# Patient Record
Sex: Female | Born: 1980 | Hispanic: No | Marital: Single | Smoking: Never smoker
Health system: Southern US, Community
[De-identification: ages and names within clinical notes are randomized; demographics above are authoritative.]

## PROBLEM LIST (undated history)

## (undated) ENCOUNTER — Inpatient Hospital Stay (HOSPITAL_COMMUNITY): Payer: Self-pay

## (undated) DIAGNOSIS — T8859XA Other complications of anesthesia, initial encounter: Secondary | ICD-10-CM

## (undated) DIAGNOSIS — T4145XA Adverse effect of unspecified anesthetic, initial encounter: Secondary | ICD-10-CM

## (undated) DIAGNOSIS — K219 Gastro-esophageal reflux disease without esophagitis: Secondary | ICD-10-CM

## (undated) DIAGNOSIS — R51 Headache: Secondary | ICD-10-CM

## (undated) DIAGNOSIS — F419 Anxiety disorder, unspecified: Secondary | ICD-10-CM

## (undated) DIAGNOSIS — R519 Headache, unspecified: Secondary | ICD-10-CM

## (undated) HISTORY — PX: BREAST SURGERY: SHX581

## (undated) HISTORY — PX: HERNIA REPAIR: SHX51

---
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
## 2014-07-22 LAB — OB RESULTS CONSOLE ANTIBODY SCREEN: ANTIBODY SCREEN: NEGATIVE

## 2014-07-22 LAB — OB RESULTS CONSOLE GC/CHLAMYDIA
CHLAMYDIA, DNA PROBE: NEGATIVE
Gonorrhea: NEGATIVE

## 2014-07-22 LAB — OB RESULTS CONSOLE ABO/RH: RH TYPE: POSITIVE

## 2014-07-22 LAB — OB RESULTS CONSOLE HIV ANTIBODY (ROUTINE TESTING): HIV: NONREACTIVE

## 2014-07-22 LAB — OB RESULTS CONSOLE HEPATITIS B SURFACE ANTIGEN: Hepatitis B Surface Ag: NEGATIVE

## 2014-07-22 LAB — OB RESULTS CONSOLE RUBELLA ANTIBODY, IGM: Rubella: IMMUNE

## 2014-07-22 LAB — OB RESULTS CONSOLE RPR: RPR: NONREACTIVE

## 2014-10-21 ENCOUNTER — Other Ambulatory Visit (HOSPITAL_COMMUNITY): Payer: Self-pay | Admitting: Obstetrics and Gynecology

## 2014-11-04 ENCOUNTER — Encounter (HOSPITAL_COMMUNITY): Payer: Self-pay

## 2014-11-04 ENCOUNTER — Inpatient Hospital Stay (HOSPITAL_COMMUNITY)
Admission: AD | Admit: 2014-11-04 | Discharge: 2014-11-05 | Disposition: A | Discharge status: Home / Self Care | Payer: Medicaid Other | Source: Ambulatory Visit | Attending: Obstetrics and Gynecology | Admitting: Obstetrics and Gynecology

## 2014-11-04 DIAGNOSIS — O4703 False labor before 37 completed weeks of gestation, third trimester: Secondary | ICD-10-CM

## 2014-11-04 DIAGNOSIS — O4702 False labor before 37 completed weeks of gestation, second trimester: Secondary | ICD-10-CM | POA: Diagnosis not present

## 2014-11-04 DIAGNOSIS — Z3A26 26 weeks gestation of pregnancy: Secondary | ICD-10-CM | POA: Diagnosis not present

## 2014-11-04 LAB — URINALYSIS, ROUTINE W REFLEX MICROSCOPIC
Bilirubin Urine: NEGATIVE
Glucose, UA: NEGATIVE mg/dL
HGB URINE DIPSTICK: NEGATIVE
KETONES UR: NEGATIVE mg/dL
NITRITE: NEGATIVE
PROTEIN: NEGATIVE mg/dL
Specific Gravity, Urine: 1.02 (ref 1.005–1.030)
UROBILINOGEN UA: 0.2 mg/dL (ref 0.0–1.0)
pH: 7.5 (ref 5.0–8.0)

## 2014-11-04 LAB — URINE MICROSCOPIC-ADD ON

## 2014-11-04 LAB — WET PREP, GENITAL
Clue Cells Wet Prep HPF POC: NONE SEEN
TRICH WET PREP: NONE SEEN
Yeast Wet Prep HPF POC: NONE SEEN

## 2014-11-04 MED ORDER — NIFEDIPINE 10 MG PO CAPS
10.0000 mg | ORAL_CAPSULE | ORAL | Status: AC
  Administered 2014-11-04 (×3): 10 mg via ORAL
  Filled 2014-11-04 (×3): qty 1

## 2014-11-04 MED ORDER — LACTATED RINGERS IV BOLUS (SEPSIS)
1000.0000 mL | Freq: Once | INTRAVENOUS | Status: AC
  Administered 2014-11-04: 1000 mL via INTRAVENOUS

## 2014-11-04 NOTE — MAU Note (Signed)
Pains since 5:30 pm, felt like constipated then was able to time contractions.  Had 5 contractions in 10 min.  No bleeding. Baby moving well today.  No leaking.

## 2014-11-04 NOTE — MAU Provider Note (Signed)
History     CSN: [id number]  Arrival date and time: 11/04/14 2138   First Provider Initiated Contact with Patient 11/04/14 2256      Chief Complaint  Patient presents with  . Contractions   HPI Comments: Cassandra Scott is a 34 y.o. G2P1001 at [redacted]w[redacted]d who presents today with contractions. She states that she started having abdominal pain a little before 2030. She spoke with her MD, and after she talked with her the contractions started coming every 2 mins. She denies any vaginal bleeding or LOF. She states that the fetus has been active. She denies any recent intercourse. She denies any complications with this pregnancy at this time. She denies any problems or preterm labor with her first pregnancy.    Abdominal Pain This is a new problem. The current episode started today. The onset quality is sudden. The problem occurs intermittently. The pain is located in the suprapubic region. The pain is at a severity of 6/10. The quality of the pain is cramping. The abdominal pain does not radiate. Pertinent negatives include no constipation, diarrhea, dysuria, fever, frequency, nausea or vomiting. Nothing aggravates the pain. The pain is relieved by nothing. She has tried nothing for the symptoms.     History reviewed. No pertinent past medical history.  History reviewed. No pertinent past surgical history.  History reviewed. No pertinent family history.  Social History  Substance Use Topics  . Smoking status: Never Smoker   . Smokeless tobacco: None  . Alcohol Use: No    Allergies: No Known Allergies  Prescriptions prior to admission  Medication Sig Dispense Refill Last Dose  . acetaminophen (TYLENOL) 325 MG tablet Take 325 mg by mouth every 6 (six) hours as needed.    11/03/2014 at Unknown time  . Prenatal Vit-Fe Fumarate-FA (MULTIVITAMIN-PRENATAL) 27-0.8 MG TABS tablet Take 1 tablet by mouth daily at 12 noon.   11/03/2014 at Unknown time    Review of Systems  Constitutional: Negative  for fever.  Gastrointestinal: Positive for abdominal pain. Negative for nausea, vomiting, diarrhea and constipation.  Genitourinary: Negative for dysuria, urgency and frequency.   Physical Exam   Blood pressure 108/67, pulse 73, temperature 98.3 F (36.8 C), temperature source Oral, resp. rate 18, height  (1.575 m), weight 65.772 kg (145 lb).  Physical Exam  Nursing note and vitals reviewed. Constitutional: She is oriented to person, place, and time. She appears well-developed and well-nourished. No distress.  HENT:  Head: Normocephalic.  Cardiovascular: Normal rate.   Respiratory: Effort normal.  GI: Soft. There is no tenderness.  Genitourinary:   External: no lesion Vagina: small amount of white discharge Cervix: pink, smooth, closed/thick/high  Uterus: AGA    Neurological: She is alert and oriented to person, place, and time.  Skin: Skin is warm and dry.  Psychiatric: She has a normal mood and affect.   FHT: 145, moderate with 10x10 accels, no decels Toco: q2 min contractions   Results for orders placed or performed during the hospital encounter of 11/04/14 (from the past 24 hour(s))  Urinalysis, Routine w reflex microscopic (not at [facility])     Status: Abnormal   Collection Time: 11/04/14  9:58 PM  Result Value Ref Range   Color, Urine YELLOW YELLOW   APPearance CLOUDY (A) CLEAR   Specific Gravity, Urine 1.020 1.005 - 1.030   pH 7.5 5.0 - 8.0   Glucose, UA NEGATIVE NEGATIVE mg/dL   Hgb urine dipstick NEGATIVE NEGATIVE   Bilirubin Urine NEGATIVE NEGATIVE  Ketones, ur NEGATIVE NEGATIVE mg/dL   Protein, ur NEGATIVE NEGATIVE mg/dL   Urobilinogen, UA 0.2 0.0 - 1.0 mg/dL   Nitrite NEGATIVE NEGATIVE   Leukocytes, UA TRACE (A) NEGATIVE  Urine microscopic-add on     Status: Abnormal   Collection Time: 11/04/14  9:58 PM  Result Value Ref Range   Squamous Epithelial / LPF FEW (A) RARE   WBC, UA 3-6 <3 WBC/hpf   RBC / HPF 0-2 <3 RBC/hpf   Urine-Other AMORPHOUS  URATES/PHOSPHATES   Wet prep, genital     Status: Abnormal   Collection Time: 11/04/14 11:07 PM  Result Value Ref Range   Yeast Wet Prep HPF POC NONE SEEN NONE SEEN   Trich, Wet Prep NONE SEEN NONE SEEN   Clue Cells Wet Prep HPF POC NONE SEEN NONE SEEN   WBC, Wet Prep HPF POC MODERATE (A) NONE SEEN  Fetal fibronectin     Status: None   Collection Time: 11/04/14 11:07 PM  Result Value Ref Range   Fetal Fibronectin NEGATIVE NEGATIVE    MAU Course  Procedures  MDM 0010: Patient has had 3 doses of procardia. She states that the contractions do not feel as intense at this time. 0016: No cervical change in one hour.   0021: D/W Dr. Senaida Ores. Will give one more dose of procardia 20 mg and watch for one more hour. If contractions remain less painful then patient may be dc home with RX for procardia.  0109; Contractions have continued to space and diminish in frequency. FFN is negative.   Assessment and Plan   1. Threatened preterm labor, third trimester    DC home Comfort measures reviewed  1st/2nd/3rd Trimester precautions  Bleeding precautions Ectopic precautions PTL precautions  Fetal kick counts RX: procardia 20 mg q 4 hours PRN #30  Return to MAU as needed FU with OB as planned  Follow-up Information    Follow up with Oliver Pila, MD.   Specialty:  Obstetrics and Gynecology   Why:  As scheduled   Contact information:   [address] [place] [address] [phone]         Tawnya Crook 11/04/2014, 10:57 PM

## 2014-11-05 DIAGNOSIS — O4703 False labor before 37 completed weeks of gestation, third trimester: Secondary | ICD-10-CM | POA: Diagnosis not present

## 2014-11-05 LAB — GC/CHLAMYDIA PROBE AMP (~~LOC~~) NOT AT ARMC
Chlamydia: NEGATIVE
Neisseria Gonorrhea: NEGATIVE

## 2014-11-05 LAB — FETAL FIBRONECTIN: Fetal Fibronectin: NEGATIVE

## 2014-11-05 MED ORDER — NIFEDIPINE 10 MG PO CAPS
10.0000 mg | ORAL_CAPSULE | ORAL | Status: DC | PRN
Start: 2014-11-05 — End: 2015-02-02

## 2014-11-05 MED ORDER — NIFEDIPINE 10 MG PO CAPS
20.0000 mg | ORAL_CAPSULE | Freq: Once | ORAL | Status: AC
  Administered 2014-11-05: 20 mg via ORAL
  Filled 2014-11-05: qty 2

## 2014-11-05 NOTE — Discharge Instructions (Signed)

## 2014-12-25 ENCOUNTER — Inpatient Hospital Stay (HOSPITAL_COMMUNITY)
Admission: AD | Admit: 2014-12-25 | Discharge: 2014-12-25 | Disposition: A | Discharge status: Home / Self Care | Payer: Medicaid Other | Source: Ambulatory Visit | Attending: Obstetrics and Gynecology | Admitting: Obstetrics and Gynecology

## 2014-12-25 ENCOUNTER — Encounter (HOSPITAL_COMMUNITY): Payer: Self-pay | Admitting: *Deleted

## 2014-12-25 DIAGNOSIS — O26893 Other specified pregnancy related conditions, third trimester: Secondary | ICD-10-CM | POA: Insufficient documentation

## 2014-12-25 DIAGNOSIS — O4703 False labor before 37 completed weeks of gestation, third trimester: Secondary | ICD-10-CM

## 2014-12-25 DIAGNOSIS — O479 False labor, unspecified: Secondary | ICD-10-CM

## 2014-12-25 DIAGNOSIS — Z3A33 33 weeks gestation of pregnancy: Secondary | ICD-10-CM | POA: Insufficient documentation

## 2014-12-25 DIAGNOSIS — R197 Diarrhea, unspecified: Secondary | ICD-10-CM | POA: Diagnosis present

## 2014-12-25 LAB — URINALYSIS, ROUTINE W REFLEX MICROSCOPIC
BILIRUBIN URINE: NEGATIVE
Glucose, UA: NEGATIVE mg/dL
Hgb urine dipstick: NEGATIVE
KETONES UR: NEGATIVE mg/dL
Nitrite: NEGATIVE
PH: 6 (ref 5.0–8.0)
PROTEIN: NEGATIVE mg/dL
Specific Gravity, Urine: 1.015 (ref 1.005–1.030)
Urobilinogen, UA: 0.2 mg/dL (ref 0.0–1.0)

## 2014-12-25 LAB — URINE MICROSCOPIC-ADD ON

## 2014-12-25 LAB — AMNISURE RUPTURE OF MEMBRANE (ROM) NOT AT ARMC: Amnisure ROM: NEGATIVE

## 2014-12-25 LAB — FETAL FIBRONECTIN: FETAL FIBRONECTIN: NEGATIVE

## 2014-12-25 MED ORDER — NIFEDIPINE 10 MG PO CAPS
20.0000 mg | ORAL_CAPSULE | Freq: Once | ORAL | Status: AC
  Administered 2014-12-25: 20 mg via ORAL
  Filled 2014-12-25: qty 2

## 2014-12-25 MED ORDER — LACTATED RINGERS IV BOLUS (SEPSIS)
1000.0000 mL | Freq: Once | INTRAVENOUS | Status: AC
  Administered 2014-12-25: 1000 mL via INTRAVENOUS

## 2014-12-25 MED ORDER — NIFEDIPINE 10 MG PO CAPS: 10.0000 mg | ORAL_CAPSULE | ORAL | Status: DC | PRN

## 2014-12-25 NOTE — Discharge Instructions (Signed)
Braxton Hicks Contractions °Contractions of the uterus can occur throughout pregnancy. Contractions are not always a sign that you are in labor.  °WHAT ARE BRAXTON HICKS CONTRACTIONS?  °Contractions that occur before labor are called Braxton Hicks contractions, or false labor. Toward the end of pregnancy (32-34 weeks), these contractions can develop more often and may become more forceful. This is not true labor because these contractions do not result in opening (dilatation) and thinning of the cervix. They are sometimes difficult to tell apart from true labor because these contractions can be forceful and people have different pain tolerances. You should not feel embarrassed if you go to the hospital with false labor. Sometimes, the only way to tell if you are in true labor is for your health care provider to look for changes in the cervix. °If there are no prenatal problems or other health problems associated with the pregnancy, it is completely safe to be sent home with false labor and await the onset of true labor. °HOW CAN YOU TELL THE DIFFERENCE BETWEEN TRUE AND FALSE LABOR? °False Labor °· The contractions of false labor are usually shorter and not as hard as those of true labor.   °· The contractions are usually irregular.   °· The contractions are often felt in the front of the lower abdomen and in the groin.   °· The contractions may go away when you walk around or change positions while lying down.   °· The contractions get weaker and are shorter lasting as time goes on.   °· The contractions do not usually become progressively stronger, regular, and closer together as with true labor.   °True Labor °· Contractions in true labor last 30-70 seconds, become very regular, usually become more intense, and increase in frequency.   °· The contractions do not go away with walking.   °· The discomfort is usually felt in the top of the uterus and spreads to the lower abdomen and low back.   °· True labor can be  determined by your health care provider with an exam. This will show that the cervix is dilating and getting thinner.   °WHAT TO REMEMBER °· Keep up with your usual exercises and follow other instructions given by your health care provider.   °· Take medicines as directed by your health care provider.   °· Keep your regular prenatal appointments.   °· Eat and drink lightly if you think you are going into labor.   °· If Braxton Hicks contractions are making you uncomfortable:   °· Change your position from lying down or resting to walking, or from walking to resting.   °· Sit and rest in a tub of warm water.   °· Drink 2-3 glasses of water. Dehydration may cause these contractions.   °· Do slow and deep breathing several times an hour.   °WHEN SHOULD I SEEK IMMEDIATE MEDICAL CARE? °Seek immediate medical care if: °· Your contractions become stronger, more regular, and closer together.   °· You have fluid leaking or gushing from your vagina.   °· You have a fever.   °· You pass blood-tinged mucus.   °· You have vaginal bleeding.   °· You have continuous abdominal pain.   °· You have low back pain that you never had before.   °· You feel your baby's head pushing down and causing pelvic pressure.   °· Your baby is not moving as much as it used to.   °Document Released: 03/13/2005 Document Revised: 03/18/2013 Document Reviewed: 12/23/2012 °[employer]® Patient Information ©2015 [employer]. This information is not intended to replace advice given to you by your health care   provider. Make sure you discuss any questions you have with your health care provider.  Fetal Fibronectin This is a test done to help evaluate a pregnant woman's risk of pre-term delivery. It is generally done when you are 26 to [redacted] weeks pregnant and are having symptoms of premature labor. A Dacron swab is used to take a sample of cervical or vaginal fluid from the back portion of the vagina or from the area just outside the opening of the  cervix. Fetal fibronectin (fFN) is a glycoprotein that can be used to help predict the short term risk of premature delivery. fFN is produced at the boundary between the amniotic sac and the lining of the mother's uterus. This is called the unteroplacental junction. Fetal fibronectin is largely confined to this junction and thought to help maintain the integrity of the boundary. fFN is normally detectable in cervicovaginal fluid during the first 20 to 24 weeks of pregnancy, and then is detectable again after about 36 weeks.  Finding fFN in cervicovaginal fluids after 36 weeks is not unusual as it is often released by the body as it gets ready for childbirth. The elevated fFN found in vaginal fluids early in pregnancy may simply reflect the normal growth and establishment of tissues at the unteroplacental junction with levels falling when this phase is complete. What is known is that fFN that is detected between 24 and 36 weeks of pregnancy is not normal. Elevated levels reflect a disturbance at the uteroplacental junction and have been associated with an increased risk of pre-term labor and delivery. Knowing whether or not a woman is likely to deliver prematurely helps your caregiver plan a course of action. The fFN test is a relatively non-invasive tool to help the caregiver to distinguish between those who are likely to deliver shortly and those who are not.  PREPARATION FOR TEST   Inform the person conducting the test if you have a medical condition or are using any medications that cause excessive bleeding.  Do not have sexual intercourse for 24 hours before the procedure. NORMAL FINDINGS  Pregnancy = 50 nanograms/ml Ranges for normal findings may vary among different laboratories and hospitals. You should always check with your doctor after having lab work or other tests done to discuss the meaning of your test results and whether your values are considered within normal limits. MEANING OF TEST   Your caregiver will go over the test results with you and discuss the importance and meaning of your results, as well as treatment options and the need for additional tests if necessary. OBTAINING THE TEST RESULTS  It is your responsibility to obtain your test results. Ask the lab or department performing the test when and how you will get your results. Document Released: 01/13/2004 Document Revised: 06/05/2011 Document Reviewed: 06/09/2013 [facility] Patient Information 2015 [place], [place]. This information is not intended to replace advice given to you by your health care provider. Make sure you discuss any questions you have with your health care provider.  Pelvic Rest Pelvic rest is sometimes recommended for women when:   The placenta is partially or completely covering the opening of the cervix (placenta previa).  There is bleeding between the uterine wall and the amniotic sac in the first trimester (subchorionic hemorrhage).  The cervix begins to open without labor starting (incompetent cervix, cervical insufficiency).  The labor is too early (preterm labor). HOME CARE INSTRUCTIONS  Do not have sexual intercourse, stimulation, or an orgasm.  Do not use tampons, douche,  or put anything in the vagina.  Do not lift anything over 10 pounds (4.5 kg).  Avoid strenuous activity or straining your pelvic muscles. SEEK MEDICAL CARE IF:  You have any vaginal bleeding during pregnancy. Treat this as a potential emergency.  You have cramping pain felt low in the stomach (stronger than menstrual cramps).  You notice vaginal discharge (watery, mucus, or bloody).  You have a low, dull backache.  There are regular contractions or uterine tightening. SEEK IMMEDIATE MEDICAL CARE IF: You have vaginal bleeding and have placenta previa.  Document Released: 07/08/2010 Document Revised: 06/05/2011 Document Reviewed: 07/08/2010 [facility] Patient Information 2015 [place], [place]. This information  is not intended to replace advice given to you by your health care provider. Make sure you discuss any questions you have with your health care provider.

## 2014-12-25 NOTE — MAU Note (Signed)
Diarrhea today having contractions at least 10 in one hour, history of preterm labor at 26 weeks, denies vaginal bleeding or discharge.

## 2014-12-25 NOTE — MAU Provider Note (Signed)
Chief Complaint:  Contractions and Diarrhea   First Provider Initiated Contact with Patient 12/25/14 1903      HPI: Cassandra Scott is a 34 y.o. G2P1001 at 24w4dwho presents to maternity admissions reporting onset of loose stools and diarrhea 2 days ago with frequent soft bowel movements each day and diarrhea x 1-2 daily, then onset of abdominal cramping/contractions at 3 pm today that have gradually worsened.  She has lower pelvic pain with contractions that is stronger than when they started this afternoon.  While in MAU, she reports leakage of clear fluid, but not enough to require a pad. She reports good fetal movement, denies LOF, vaginal bleeding, vaginal itching/burning, urinary symptoms, h/a, dizziness, n/v, or fever/chills.    Diarrhea  This is a new problem. The current episode started in the past 7 days. The problem occurs less than 2 times per day. The problem has been unchanged. The stool consistency is described as watery. The patient states that diarrhea does not awaken her from sleep. Associated symptoms include abdominal pain. Pertinent negatives include no chills, fever, headaches or vomiting. Nothing aggravates the symptoms. There are no known risk factors. She has tried increased fluids for the symptoms.  Abdominal Cramping This is a new problem. The current episode started today. The onset quality is gradual. The problem occurs intermittently. The problem has been gradually worsening. The pain is located in the generalized abdominal region and suprapubic region. The pain is moderate. The quality of the pain is cramping. The abdominal pain radiates to the pelvis. Associated symptoms include diarrhea. Pertinent negatives include no constipation, dysuria, fever, frequency, headaches, nausea or vomiting. Nothing aggravates the pain. The pain is relieved by nothing. She has tried nothing for the symptoms.    Past Medical History: History reviewed. No pertinent past medical  history.  Past obstetric history: OB History  Gravida Para Term Preterm AB SAB TAB Ectopic Multiple Living  # Outcome Date GA Lbr Len/2nd Weight Sex Delivery Anes PTL Lv  2 Current           1 Term 07/19/08     [id number]         Past Surgical History: Past Surgical History  Procedure Laterality Date  . Cesarean section    . Breast surgery      Family History: History reviewed. No pertinent family history.  Social History: Social History  Substance Use Topics  . Smoking status: Never Smoker   . Smokeless tobacco: None  . Alcohol Use: No    Allergies: No Known Allergies  Meds:  Prescriptions prior to admission  Medication Sig Dispense Refill Last Dose  . acetaminophen (TYLENOL) 500 MG tablet Take 500 mg by mouth every 6 (six) hours as needed for headache.   Past Month at Unknown time  . Prenatal Vit-Fe Fumarate-FA (MULTIVITAMIN-PRENATAL) 27-0.8 MG TABS tablet Take 1 tablet by mouth daily at 12 noon.   12/24/2014 at Unknown time  . NIFEdipine (PROCARDIA) 10 MG capsule Take 1-2 capsules (10-20 mg total) by mouth every 4 (four) hours as needed. (Patient not taking: Reported on 12/25/2014) 30 capsule 0 Completed Course at Unknown time    ROS:  Review of Systems  Constitutional: Negative for fever, chills and fatigue.  HENT: Negative for sinus pressure.   Eyes: Negative for photophobia.  Respiratory: Negative for shortness of breath.   Cardiovascular: Negative for chest pain.  Gastrointestinal: Positive for abdominal pain and diarrhea. Negative  for nausea, vomiting and constipation.  Genitourinary: Negative for dysuria, frequency, flank pain, vaginal bleeding, vaginal discharge, difficulty urinating, vaginal pain and pelvic pain.  Musculoskeletal: Negative for neck pain.  Neurological: Negative for dizziness, weakness and headaches.  Psychiatric/Behavioral: Negative.      I have reviewed patient's Past Medical Hx, Surgical Hx, Family Hx, Social Hx,  medications and allergies.   Physical Exam  Patient Vitals for the past 24 hrs:  BP Temp Temp src Pulse Resp Height Weight  12/25/14 1814 109/68 mmHg 98 F (36.7 C) Oral 84 18  (1.575 m) 69.491 kg (153 lb 3.2 oz)   Constitutional: Well-developed, well-nourished female in no acute distress.  Cardiovascular: normal rate Respiratory: normal effort GI: Abd soft, non-tender, gravid appropriate for gestational age.  MS: Extremities nontender, no edema, normal ROM Neurologic: Alert and oriented x 4.  GU: Neg CVAT.   Dilation: Closed Effacement (%): Thick Cervical Position: Posterior Exam by:: Sharen Counter, CNM  FHT:  Baseline 135 , moderate variability, accelerations present, no decelerations Contractions: q 2-3 mins, mild to palpation   Labs: Results for orders placed or performed during the hospital encounter of 12/25/14 (from the past 24 hour(s))  Urinalysis, Routine w reflex microscopic (not at [facility])     Status: Abnormal   Collection Time: 12/25/14  6:15 PM  Result Value Ref Range   Color, Urine YELLOW YELLOW   APPearance CLEAR CLEAR   Specific Gravity, Urine 1.015 1.005 - 1.030   pH 6.0 5.0 - 8.0   Glucose, UA NEGATIVE NEGATIVE mg/dL   Hgb urine dipstick NEGATIVE NEGATIVE   Bilirubin Urine NEGATIVE NEGATIVE   Ketones, ur NEGATIVE NEGATIVE mg/dL   Protein, ur NEGATIVE NEGATIVE mg/dL   Urobilinogen, UA 0.2 0.0 - 1.0 mg/dL   Nitrite NEGATIVE NEGATIVE   Leukocytes, UA TRACE (A) NEGATIVE  Urine microscopic-add on     Status: Abnormal   Collection Time: 12/25/14  6:15 PM  Result Value Ref Range   Squamous Epithelial / LPF RARE RARE   WBC, UA 0-2 <3 WBC/hpf   RBC / HPF 0-2 <3 RBC/hpf   Bacteria, UA FEW (A) RARE  Amnisure rupture of membrane (rom)not at [facility]     Status: None   Collection Time: 12/25/14  7:10 PM  Result Value Ref Range   Amnisure ROM NEGATIVE   Fetal fibronectin     Status: None   Collection Time: 12/25/14  7:10 PM  Result Value Ref Range    Fetal Fibronectin NEGATIVE NEGATIVE      Imaging:  No results found.  MAU Course/MDM: I have ordered labs and reviewed results.  Consult Dr Hinton Rao to review assessment, labs, FHR tracing.  Treatments in MAU included Procardia 20 mg x 1 dose PO, LR x 1000 ml bolus.    FFN pending. Procardia 20 mg PO dose and IV fluids ordered.  Report to Venia Carbon, NP  Cervix rechecked and unchanged @ 2130 patient states contractions are still felt however much less.  Dr. Ellyn Hack contacted and unable to talk at this time due to an emergency and will call back as soon as possible.   Discussed patient status with Dr. Ellyn Hack @ 2250> FFN negative, Amnisure negative. Patient continues to have contractions every 3-4 minutes however pain is decreased. Patient rates her pain 4/10, down from 7/10 when she came in . Patient is eating in MAU, conversing with hesitation. Patient feels comfortable going home and will return if symptoms worsen. Patient was given preterm labor precautions and  strongly encouraged to return if patient worsens throughout the night>vaginal bleeding or leaking of fluid. Kick counts reviewed.   Sharen Counter Certified Nurse-Midwife 12/25/2014 7:52 PM   Duane Lope, NP 12/25/2014 10:56 PM

## 2014-12-25 NOTE — MAU Note (Signed)
Patient states is scheduled for repeat c-secton on Nov 8th.

## 2015-01-27 ENCOUNTER — Other Ambulatory Visit: Payer: Self-pay | Admitting: Obstetrics and Gynecology

## 2015-02-01 ENCOUNTER — Encounter (HOSPITAL_COMMUNITY)
Admission: RE | Admit: 2015-02-01 | Discharge: 2015-02-01 | Disposition: A | Discharge status: Home / Self Care | Payer: Medicaid Other | Source: Ambulatory Visit | Attending: Obstetrics and Gynecology | Admitting: Obstetrics and Gynecology

## 2015-02-01 ENCOUNTER — Encounter (HOSPITAL_COMMUNITY): Payer: Self-pay

## 2015-02-01 HISTORY — DX: Gastro-esophageal reflux disease without esophagitis: K21.9

## 2015-02-01 HISTORY — DX: Adverse effect of unspecified anesthetic, initial encounter: T41.45XA

## 2015-02-01 HISTORY — DX: Headache: R51

## 2015-02-01 HISTORY — DX: Anxiety disorder, unspecified: F41.9

## 2015-02-01 HISTORY — DX: Headache, unspecified: R51.9

## 2015-02-01 HISTORY — DX: Other complications of anesthesia, initial encounter: T88.59XA

## 2015-02-01 LAB — CBC WITH DIFFERENTIAL/PLATELET
BASOS PCT: 0 %
Basophils Absolute: 0 10*3/uL (ref 0.0–0.1)
Eosinophils Absolute: 0 10*3/uL (ref 0.0–0.7)
Eosinophils Relative: 0 %
HEMATOCRIT: 36.2 % (ref 36.0–46.0)
Hemoglobin: 12.1 g/dL (ref 12.0–15.0)
Lymphocytes Relative: 20 %
Lymphs Abs: 1.5 10*3/uL (ref 0.7–4.0)
MCH: 30.2 pg (ref 26.0–34.0)
MCHC: 33.4 g/dL (ref 30.0–36.0)
MCV: 90.3 fL (ref 78.0–100.0)
MONO ABS: 0.6 10*3/uL (ref 0.1–1.0)
MONOS PCT: 8 %
NEUTROS ABS: 5.3 10*3/uL (ref 1.7–7.7)
Neutrophils Relative %: 72 %
Platelets: 156 10*3/uL (ref 150–400)
RBC: 4.01 MIL/uL (ref 3.87–5.11)
RDW: 14.4 % (ref 11.5–15.5)
WBC: 7.4 10*3/uL (ref 4.0–10.5)

## 2015-02-01 LAB — COMPREHENSIVE METABOLIC PANEL
ALBUMIN: 3 g/dL — AB (ref 3.5–5.0)
ALT: 15 U/L (ref 14–54)
AST: 19 U/L (ref 15–41)
Alkaline Phosphatase: 191 U/L — ABNORMAL HIGH (ref 38–126)
Anion gap: 7 (ref 5–15)
BILIRUBIN TOTAL: 0.7 mg/dL (ref 0.3–1.2)
BUN: 12 mg/dL (ref 6–20)
CALCIUM: 8.8 mg/dL — AB (ref 8.9–10.3)
CHLORIDE: 106 mmol/L (ref 101–111)
CO2: 21 mmol/L — ABNORMAL LOW (ref 22–32)
CREATININE: 0.62 mg/dL (ref 0.44–1.00)
GFR calc Af Amer: >60 mL/min (ref >60)
Glucose, Bld: 76 mg/dL (ref 65–99)
Potassium: 4 mmol/L (ref 3.5–5.1)
Sodium: 134 mmol/L — ABNORMAL LOW (ref 135–145)
Total Protein: 6.5 g/dL (ref 6.5–8.1)

## 2015-02-01 LAB — TYPE AND SCREEN
ABO/RH(D): O POS
Antibody Screen: NEGATIVE

## 2015-02-01 LAB — ABO/RH: ABO/RH(D): O POS

## 2015-02-01 NOTE — Patient Instructions (Signed)
Your procedure is scheduled on:02/02/15  Enter through the Main Entrance at :6am Pick up desk phone and dial [phone] and inform us of your arrival.  Please call [phone] if you have any problems the morning of surgery.  Remember: Do not eat food or drink liquids, including water, after midnight:tonight   You may brush your teeth the morning of surgery.  Take these meds the morning of surgery with a sip of water:none  DO NOT wear jewelry, eye make-up, lipstick,body lotion, or dark fingernail polish.  (Polished toes are ok) You may wear deodorant.  If you are to be admitted after surgery, leave suitcase in car until your room has been assigned. Patients discharged on the day of surgery will not be allowed to drive home. Wear loose fitting, comfortable clothes for your ride home.

## 2015-02-01 NOTE — H&P (Deleted)
NAMRondall Scott:  Scott, Cassandra             ACCOUNT NO.:  [id number]  MEDICAL RECORD NO.:  [id number]  LOCATION:  [facility]                           FACILITY:  [facility]  PHYSICIAN:  Malachi Prohomas F. Ambrose MantleHenley, M.D. DATE OF BIRTH:  07/23/1980  DATE OF ADMISSION:  02/01/2015 DATE OF DISCHARGE:                             HISTORY & PHYSICAL   PRESENT ILLNESS:  This is a 34 year old white female, para 1-0-0-1, gravida 2 at 39 weeks and 1 day admitted for repeat C-section.  She had a prior C-section and declined a vaginal birth.  With her first C- section, she was given a spinal and 5 minutes later she had severe vomiting and the worst headache she had ever had.  She could not function in the recovery room.  She was given IV med that relieved her headache and corrected the vomiting.  Her blood group and type is O positive with a negative antibody.  RPR negative.  Urine culture negative.  Hepatitis B surface antigen negative.  HIV negative.  GC and Chlamydia negative.  Varicella not immune.  Rubella immune.  Cystic fibrosis screen negative.  First trimester screen negative.  AFP negative.  One-hour Glucola 109.  Repeat HIV and RPR negative.  Group B strep negative.  Bile acids were checked in late pregnancy and they were normal as well as liver function tests.  The patient began her prenatal course  at 10 weeks.  She had an ultrasound on July 17, 2014, placed her at 10 weeks with a due date of February 08, 2015.  She progressed in pregnancy and initially had poor weight gain, only 3 pounds by 18 weeks and 6 days.  Her total weight gain for the entire pregnancy, however, is 25 pounds.  At 31 weeks, she did have problems with itching.  At 33 weeks, she had a lot of painful contractions, seen in [facility], fetal fibronectin test was negative.  On January 04, 2015, she was having a lot of contractions but her cervix was closed.  She reported more contractions today.  She was examined in the office.  The cervix was still long  and closed, the presenting part was high.  If she makes it to the morning of November 8, she will have repeat C-section.  If she goes into labor prior, she will have a C-section then.  PAST MEDICAL HISTORY:  Negative.  SURGICAL HISTORY:  C-section in April 2010, breast implants in 2005 and 2008.  Her C-section was low-transverse. According to the notes there, her headache began after the spinal but before the incision and improved during the case.  ALLERGIES:  There are no known drug allergies.  No latex or food allergies.  SOCIAL HISTORY:  She never smoked.  Does not drink.  Denies drugs.  Has 12th grade of education.  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  FAMILY HISTORY:  Mother with anxiety and panic attacks.  Brother with asthma, ADHD.  Father with asthma. Maternal grandmother with DVT.  Son has attention deficit and hyperactive disorder.  PHYSICAL EXAMINATION:  VITAL SIGNS:  On February 01, 2015, blood pressure was 100/66, weight 161.6, pulse was 80. HEART:  Normal size and sounds.  No murmurs. LUNGS:  Clear to auscultation. ABDOMEN:  Fundal height was 40 cm.  Fetal heart tones normal.  Cervix was long and closed, presenting part high.  ADMITTING IMPRESSION:  Intrauterine pregnancy at 39 weeks, prior C- section, declined VBAC.  The patient is admitted for C-section.  She has been informed that the art on her lower abdomen is very close to the midline and I may or may not be able to stay away from cutting through the tattoos.  She understands and agrees.     Malachi Pro. Ambrose Mantle, M.D.     TFH/MEDQ  D:  02/01/2015  T:  02/01/2015  Job:  [id number]

## 2015-02-01 NOTE — H&P (Signed)
NAMEANNETTA, DEISS NO.:  [id number]  MEDICAL RECORD NO.:  [id number]  LOCATION:  9132                          FACILITY:  [facility]  PHYSICIAN:  Malachi Pro. Ambrose Mantle, M.D. DATE OF BIRTH:  [birth year]  DATE OF ADMISSION:  02/02/2015 DATE OF DISCHARGE:  02/04/2015                             HISTORY & PHYSICAL   PRESENT ILLNESS:  This is a 34 year old white female, para 1-0-0-1, gravida 2 at 39 weeks and 1 day admitted for repeat C-section.  She had a prior C-section and declined a vaginal birth.  With her first C- section, she was given a spinal and 5 minutes later she had severe vomiting and the worst headache she had ever had.  She could not function in the recovery room.  She was given IV med that relieved her headache and corrected the vomiting.  Her blood group and type is O positive with a negative antibody.  RPR negative.  Urine culture negative.  Hepatitis B surface antigen negative.  HIV negative.  GC and Chlamydia negative.  Varicella not immune.  Rubella immune.  Cystic fibrosis screen negative.  First trimester screen negative.  AFP negative.  One-hour Glucola 109.  Repeat HIV and RPR negative.  Group B strep negative.  Bile acids were checked in late pregnancy and they were normal as well as liver function tests.  The patient began her prenatal course  at 10 weeks.  She had an ultrasound on July 17, 2014, placed her at 10 weeks with a due date of February 08, 2015.  She progressed in pregnancy and initially had poor weight gain, only 3 pounds by 18 weeks and 6 days.  Her total weight gain for the entire pregnancy, however, is 25 pounds.  At 31 weeks, she did have problems with itching.  At 33 weeks, she had a lot of painful contractions, seen in [facility], fetal fibronectin test was negative.  On January 04, 2015, she was having a lot of contractions but her cervix was closed.  She reported more contractions today.  She was examined in the office.  The cervix  was still long and closed, the presenting part was high.  If she makes it to the morning of November 8, she will have repeat C-section.  If she goes into labor prior, she will have a C-section then.  PAST MEDICAL HISTORY:  Negative.  SURGICAL HISTORY:  C-section in April 2010, breast implants in 2005 and 2008.  Her C-section was low-transverse. According to the notes there, her headache began after the spinal but before the incision and improved during the case.  ALLERGIES:  There are no known drug allergies.  No latex or food allergies.  SOCIAL HISTORY:  She never smoked.  Does not drink.  Denies drugs.  Has 12th grade of education.  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  FAMILY HISTORY:  Mother with anxiety and panic attacks.  Brother with asthma, ADHD.  Father with asthma. Maternal grandmother with DVT.  Son has attention deficit and hyperactive disorder.  PHYSICAL EXAMINATION:  VITAL SIGNS:  On February 01, 2015, blood pressure was 100/66, weight 161.6, pulse was 80. HEART:  Normal size and sounds.  No murmurs.  LUNGS:  Clear to auscultation. ABDOMEN:  Fundal height was 40 cm.  Fetal heart tones normal.  Cervix was long and closed, presenting part high.  ADMITTING IMPRESSION:  Intrauterine pregnancy at 39 weeks, prior C- section, declined VBAC.  The patient is admitted for C-section.  She has been informed that the art on her lower abdomen is very close to the midline and I may or may not be able to stay away from cutting through the tattoos.  She understands and agrees.     Malachi Prohomas F. Ambrose MantleHenley, M.D.     TFH/MEDQ  D:  02/01/2015  T:  02/01/2015  Job:  [id number]

## 2015-02-02 ENCOUNTER — Inpatient Hospital Stay (HOSPITAL_COMMUNITY): Payer: Medicaid Other | Admitting: Anesthesiology

## 2015-02-02 ENCOUNTER — Encounter (HOSPITAL_COMMUNITY): Payer: Self-pay

## 2015-02-02 ENCOUNTER — Inpatient Hospital Stay (HOSPITAL_COMMUNITY)
Admission: RE | Admit: 2015-02-02 | Discharge: 2015-02-04 | DRG: 766 | Disposition: A | Discharge status: Home / Self Care | Payer: Medicaid Other | Source: Ambulatory Visit | Attending: Obstetrics and Gynecology | Admitting: Obstetrics and Gynecology

## 2015-02-02 ENCOUNTER — Encounter (HOSPITAL_COMMUNITY)
Admission: RE | Disposition: A | Discharge status: Home / Self Care | Payer: Self-pay | Source: Ambulatory Visit | Attending: Obstetrics and Gynecology

## 2015-02-02 DIAGNOSIS — O34211 Maternal care for low transverse scar from previous cesarean delivery: Secondary | ICD-10-CM | POA: Diagnosis present

## 2015-02-02 DIAGNOSIS — O34219 Maternal care for unspecified type scar from previous cesarean delivery: Secondary | ICD-10-CM | POA: Diagnosis present

## 2015-02-02 DIAGNOSIS — Z98891 History of uterine scar from previous surgery: Secondary | ICD-10-CM

## 2015-02-02 DIAGNOSIS — Z3A39 39 weeks gestation of pregnancy: Secondary | ICD-10-CM

## 2015-02-02 LAB — RPR: RPR: NONREACTIVE

## 2015-02-02 SURGERY — Surgical Case
Anesthesia: Spinal

## 2015-02-02 MED ORDER — MEASLES, MUMPS & RUBELLA VAC ~~LOC~~ INJ: 0.5000 mL | INJECTION | Freq: Once | SUBCUTANEOUS | Status: DC

## 2015-02-02 MED ORDER — LACTATED RINGERS IV SOLN
INTRAVENOUS | Status: DC
  Administered 2015-02-02: 16:00:00 via INTRAVENOUS

## 2015-02-02 MED ORDER — GLYCOPYRROLATE 0.2 MG/ML IJ SOLN
INTRAMUSCULAR | Status: AC
  Filled 2015-02-02: qty 1

## 2015-02-02 MED ORDER — NALOXONE HCL 0.4 MG/ML IJ SOLN: 0.4000 mg | INTRAMUSCULAR | Status: DC | PRN

## 2015-02-02 MED ORDER — NALBUPHINE HCL 10 MG/ML IJ SOLN: 5.0000 mg | Freq: Once | INTRAMUSCULAR | Status: DC | PRN

## 2015-02-02 MED ORDER — PHENYLEPHRINE 40 MCG/ML (10ML) SYRINGE FOR IV PUSH (FOR BLOOD PRESSURE SUPPORT)
PREFILLED_SYRINGE | INTRAVENOUS | Status: AC
  Filled 2015-02-02: qty 10

## 2015-02-02 MED ORDER — IBUPROFEN 600 MG PO TABS
600.0000 mg | ORAL_TABLET | Freq: Four times a day (QID) | ORAL | Status: DC
Start: 2015-02-02 — End: 2015-02-04
  Administered 2015-02-02 – 2015-02-04 (×6): 600 mg via ORAL
  Filled 2015-02-02 (×6): qty 1

## 2015-02-02 MED ORDER — GLYCOPYRROLATE 0.2 MG/ML IJ SOLN
INTRAMUSCULAR | Status: DC | PRN
  Administered 2015-02-02: 0.2 mg via INTRAVENOUS

## 2015-02-02 MED ORDER — KETOROLAC TROMETHAMINE 30 MG/ML IJ SOLN
INTRAMUSCULAR | Status: AC
  Filled 2015-02-02: qty 1

## 2015-02-02 MED ORDER — PHENYLEPHRINE 8 MG IN D5W 100 ML (0.08MG/ML) PREMIX OPTIME
INJECTION | INTRAVENOUS | Status: DC | PRN
  Administered 2015-02-02: 60 ug/min via INTRAVENOUS

## 2015-02-02 MED ORDER — MEPERIDINE HCL 25 MG/ML IJ SOLN: 6.2500 mg | INTRAMUSCULAR | Status: DC | PRN

## 2015-02-02 MED ORDER — CEFAZOLIN SODIUM-DEXTROSE 2-3 GM-% IV SOLR
2.0000 g | INTRAVENOUS | Status: AC
  Administered 2015-02-02: 2 g via INTRAVENOUS

## 2015-02-02 MED ORDER — NALBUPHINE HCL 10 MG/ML IJ SOLN: 5.0000 mg | INTRAMUSCULAR | Status: DC | PRN

## 2015-02-02 MED ORDER — SIMETHICONE 80 MG PO CHEW: 80.0000 mg | CHEWABLE_TABLET | ORAL | Status: DC | PRN

## 2015-02-02 MED ORDER — DIPHENHYDRAMINE HCL 50 MG/ML IJ SOLN: 12.5000 mg | INTRAMUSCULAR | Status: DC | PRN

## 2015-02-02 MED ORDER — SIMETHICONE 80 MG PO CHEW
80.0000 mg | CHEWABLE_TABLET | Freq: Three times a day (TID) | ORAL | Status: DC
  Administered 2015-02-02 – 2015-02-04 (×6): 80 mg via ORAL
  Filled 2015-02-02 (×6): qty 1

## 2015-02-02 MED ORDER — LACTATED RINGERS IV SOLN
INTRAVENOUS | Status: DC
  Administered 2015-02-02 (×2): 125 mL/h via INTRAVENOUS

## 2015-02-02 MED ORDER — PHENYLEPHRINE 8 MG IN D5W 100 ML (0.08MG/ML) PREMIX OPTIME
INJECTION | INTRAVENOUS | Status: AC
Start: 2015-02-02 — End: 2015-02-02
  Filled 2015-02-02: qty 100

## 2015-02-02 MED ORDER — KETOROLAC TROMETHAMINE 30 MG/ML IJ SOLN: 30.0000 mg | Freq: Four times a day (QID) | INTRAMUSCULAR | Status: AC | PRN

## 2015-02-02 MED ORDER — ZOLPIDEM TARTRATE 5 MG PO TABS: 5.0000 mg | ORAL_TABLET | Freq: Every evening | ORAL | Status: DC | PRN

## 2015-02-02 MED ORDER — ONDANSETRON HCL 4 MG/2ML IJ SOLN
INTRAMUSCULAR | Status: DC | PRN
  Administered 2015-02-02: 4 mg via INTRAVENOUS

## 2015-02-02 MED ORDER — FENTANYL CITRATE (PF) 100 MCG/2ML IJ SOLN: 25.0000 ug | INTRAMUSCULAR | Status: DC | PRN

## 2015-02-02 MED ORDER — ACETAMINOPHEN 325 MG PO TABS
650.0000 mg | ORAL_TABLET | ORAL | Status: DC | PRN
  Administered 2015-02-03 – 2015-02-04 (×5): 650 mg via ORAL
  Filled 2015-02-02 (×5): qty 2

## 2015-02-02 MED ORDER — PHENYLEPHRINE HCL 10 MG/ML IJ SOLN
INTRAMUSCULAR | Status: DC | PRN
  Administered 2015-02-02 (×3): 40 ug via INTRAVENOUS
  Administered 2015-02-02: 80 ug via INTRAVENOUS

## 2015-02-02 MED ORDER — ONDANSETRON HCL 4 MG/2ML IJ SOLN
INTRAMUSCULAR | Status: AC
  Filled 2015-02-02: qty 2

## 2015-02-02 MED ORDER — SCOPOLAMINE 1 MG/3DAYS TD PT72
1.0000 | MEDICATED_PATCH | Freq: Once | TRANSDERMAL | Status: DC
  Administered 2015-02-02: 1.5 mg via TRANSDERMAL

## 2015-02-02 MED ORDER — MORPHINE SULFATE (PF) 0.5 MG/ML IJ SOLN
INTRAMUSCULAR | Status: DC | PRN
Start: 2015-02-02 — End: 2015-02-02
  Administered 2015-02-02: .1 mg via EPIDURAL

## 2015-02-02 MED ORDER — CEFAZOLIN SODIUM-DEXTROSE 2-3 GM-% IV SOLR
INTRAVENOUS | Status: AC
  Filled 2015-02-02: qty 50

## 2015-02-02 MED ORDER — MENTHOL 3 MG MT LOZG: 1.0000 | LOZENGE | OROMUCOSAL | Status: DC | PRN

## 2015-02-02 MED ORDER — TETANUS-DIPHTH-ACELL PERTUSSIS 5-2.5-18.5 LF-MCG/0.5 IM SUSP
0.5000 mL | Freq: Once | INTRAMUSCULAR | Status: DC
Start: 2015-02-03 — End: 2015-02-02

## 2015-02-02 MED ORDER — ONDANSETRON HCL 4 MG/2ML IJ SOLN: 4.0000 mg | Freq: Three times a day (TID) | INTRAMUSCULAR | Status: DC | PRN

## 2015-02-02 MED ORDER — CEFAZOLIN SODIUM-DEXTROSE 2-3 GM-% IV SOLR
2.0000 g | Freq: Three times a day (TID) | INTRAVENOUS | Status: AC
  Administered 2015-02-02 (×2): 2 g via INTRAVENOUS
  Filled 2015-02-02 (×2): qty 50

## 2015-02-02 MED ORDER — SODIUM CHLORIDE 0.9 % IJ SOLN: 3.0000 mL | INTRAMUSCULAR | Status: DC | PRN

## 2015-02-02 MED ORDER — DIPHENHYDRAMINE HCL 25 MG PO CAPS: 25.0000 mg | ORAL_CAPSULE | Freq: Four times a day (QID) | ORAL | Status: DC | PRN

## 2015-02-02 MED ORDER — OXYTOCIN 10 UNIT/ML IJ SOLN
40.0000 [IU] | INTRAVENOUS | Status: DC | PRN
  Administered 2015-02-02: 40 [IU] via INTRAVENOUS

## 2015-02-02 MED ORDER — OXYTOCIN 10 UNIT/ML IJ SOLN
INTRAMUSCULAR | Status: AC
  Filled 2015-02-02: qty 4

## 2015-02-02 MED ORDER — BUPIVACAINE IN DEXTROSE 0.75-8.25 % IT SOLN
INTRATHECAL | Status: DC | PRN
  Administered 2015-02-02: 10 mg via INTRATHECAL

## 2015-02-02 MED ORDER — DIPHENHYDRAMINE HCL 25 MG PO CAPS: 25.0000 mg | ORAL_CAPSULE | ORAL | Status: DC | PRN

## 2015-02-02 MED ORDER — FENTANYL CITRATE (PF) 100 MCG/2ML IJ SOLN
INTRAMUSCULAR | Status: AC
Start: 2015-02-02 — End: 2015-02-02
  Filled 2015-02-02: qty 4

## 2015-02-02 MED ORDER — OXYTOCIN 40 UNITS IN LACTATED RINGERS INFUSION - SIMPLE MED: 62.5000 mL/h | INTRAVENOUS | Status: AC

## 2015-02-02 MED ORDER — OXYCODONE-ACETAMINOPHEN 5-325 MG PO TABS: 2.0000 | ORAL_TABLET | ORAL | Status: DC | PRN

## 2015-02-02 MED ORDER — LANOLIN HYDROUS EX OINT: 1.0000 "application " | TOPICAL_OINTMENT | CUTANEOUS | Status: DC | PRN

## 2015-02-02 MED ORDER — MORPHINE SULFATE (PF) 0.5 MG/ML IJ SOLN
INTRAMUSCULAR | Status: AC
  Filled 2015-02-02: qty 100

## 2015-02-02 MED ORDER — KETOROLAC TROMETHAMINE 30 MG/ML IJ SOLN
30.0000 mg | Freq: Four times a day (QID) | INTRAMUSCULAR | Status: AC | PRN
  Administered 2015-02-02: 30 mg via INTRAMUSCULAR

## 2015-02-02 MED ORDER — OXYCODONE-ACETAMINOPHEN 5-325 MG PO TABS: 1.0000 | ORAL_TABLET | ORAL | Status: DC | PRN

## 2015-02-02 MED ORDER — SCOPOLAMINE 1 MG/3DAYS TD PT72
MEDICATED_PATCH | TRANSDERMAL | Status: AC
  Filled 2015-02-02: qty 1

## 2015-02-02 MED ORDER — IBUPROFEN 600 MG PO TABS
600.0000 mg | ORAL_TABLET | Freq: Four times a day (QID) | ORAL | Status: DC | PRN
  Administered 2015-02-02: 600 mg via ORAL
  Filled 2015-02-02: qty 1

## 2015-02-02 MED ORDER — PHENYLEPHRINE 8 MG IN D5W 100 ML (0.08MG/ML) PREMIX OPTIME: INJECTION | INTRAVENOUS | Status: DC | PRN

## 2015-02-02 MED ORDER — SIMETHICONE 80 MG PO CHEW
80.0000 mg | CHEWABLE_TABLET | ORAL | Status: DC
  Administered 2015-02-03 – 2015-02-04 (×2): 80 mg via ORAL
  Filled 2015-02-02: qty 1

## 2015-02-02 MED ORDER — NALOXONE HCL 2 MG/2ML IJ SOSY: 1.0000 ug/kg/h | PREFILLED_SYRINGE | INTRAVENOUS | Status: DC | PRN

## 2015-02-02 MED ORDER — WITCH HAZEL-GLYCERIN EX PADS: 1.0000 "application " | MEDICATED_PAD | CUTANEOUS | Status: DC | PRN

## 2015-02-02 MED ORDER — PRENATAL MULTIVITAMIN CH
1.0000 | ORAL_TABLET | Freq: Every day | ORAL | Status: DC
  Administered 2015-02-03: 1 via ORAL
  Filled 2015-02-02: qty 1

## 2015-02-02 MED ORDER — SENNOSIDES-DOCUSATE SODIUM 8.6-50 MG PO TABS
2.0000 | ORAL_TABLET | ORAL | Status: DC
  Administered 2015-02-03 – 2015-02-04 (×2): 2 via ORAL
  Filled 2015-02-02: qty 2

## 2015-02-02 MED ORDER — FENTANYL CITRATE (PF) 100 MCG/2ML IJ SOLN
INTRAMUSCULAR | Status: DC | PRN
  Administered 2015-02-02: 20 ug via INTRAVENOUS

## 2015-02-02 MED ORDER — DIBUCAINE 1 % RE OINT: 1.0000 "application " | TOPICAL_OINTMENT | RECTAL | Status: DC | PRN

## 2015-02-02 SURGICAL SUPPLY — 39 items
APL SKNCLS STERI-STRIP NONHPOA (GAUZE/BANDAGES/DRESSINGS) ×1
BENZOIN TINCTURE PRP APPL 2/3 (GAUZE/BANDAGES/DRESSINGS) ×2 IMPLANT
CLAMP CORD UMBIL (MISCELLANEOUS) IMPLANT
CLOSURE STERI STRIP 1/2 X4 (GAUZE/BANDAGES/DRESSINGS) ×2 IMPLANT
CLOTH BEACON ORANGE TIMEOUT ST (SAFETY) ×3 IMPLANT
CONTAINER PREFILL 10% NBF 15ML (MISCELLANEOUS) IMPLANT
DRAPE SHEET LG 3/4 BI-LAMINATE (DRAPES) IMPLANT
DRSG OPSITE POSTOP 4X10 (GAUZE/BANDAGES/DRESSINGS) ×3 IMPLANT
DRSG VASELINE 3X18 (GAUZE/BANDAGES/DRESSINGS) ×3 IMPLANT
DURAPREP 26ML APPLICATOR (WOUND CARE) ×3 IMPLANT
ELECT REM PT RETURN 9FT ADLT (ELECTROSURGICAL) ×3
ELECTRODE REM PT RTRN 9FT ADLT (ELECTROSURGICAL) ×1 IMPLANT
EXTRACTOR VACUUM KIWI (MISCELLANEOUS) IMPLANT
EXTRACTOR VACUUM M CUP 4 TUBE (SUCTIONS) IMPLANT
EXTRACTOR VACUUM M CUP 4' TUBE (SUCTIONS)
GLOVE BIO SURGEON STRL SZ7.5 (GLOVE) ×3 IMPLANT
GOWN STRL REUS W/TWL LRG LVL3 (GOWN DISPOSABLE) ×6 IMPLANT
KIT ABG SYR 3ML LUER SLIP (SYRINGE) IMPLANT
NDL HYPO 25X5/8 SAFETYGLIDE (NEEDLE) IMPLANT
NEEDLE HYPO 25X5/8 SAFETYGLIDE (NEEDLE) IMPLANT
NS IRRIG 1000ML POUR BTL (IV SOLUTION) ×3 IMPLANT
PACK C SECTION WH (CUSTOM PROCEDURE TRAY) ×3 IMPLANT
PAD ABD 7.5X8 STRL (GAUZE/BANDAGES/DRESSINGS) ×2 IMPLANT
PAD OB MATERNITY 4.3X12.25 (PERSONAL CARE ITEMS) ×3 IMPLANT
PENCIL SMOKE EVAC W/HOLSTER (ELECTROSURGICAL) ×3 IMPLANT
RETRACTOR WND ALEXIS 25 LRG (MISCELLANEOUS) IMPLANT
RTRCTR C-SECT PINK 25CM LRG (MISCELLANEOUS) IMPLANT
RTRCTR WOUND ALEXIS 25CM LRG (MISCELLANEOUS) ×3
SPONGE GAUZE 4X4 12PLY STER LF (GAUZE/BANDAGES/DRESSINGS) ×4 IMPLANT
STAPLER VISISTAT 35W (STAPLE) ×2 IMPLANT
SUT PLAIN 0 NONE (SUTURE) IMPLANT
SUT VIC AB 0 CT1 36 (SUTURE) ×24 IMPLANT
SUT VIC AB 3-0 CTX 36 (SUTURE) ×3 IMPLANT
SUT VIC AB 3-0 SH 27 (SUTURE)
SUT VIC AB 3-0 SH 27X BRD (SUTURE) IMPLANT
SUT VIC AB 4-0 KS 27 (SUTURE) ×2 IMPLANT
SUT VICRYL 0 TIES 12 18 (SUTURE) IMPLANT
TOWEL OR 17X24 6PK STRL BLUE (TOWEL DISPOSABLE) ×3 IMPLANT
TRAY FOLEY CATH SILVER 14FR (SET/KITS/TRAYS/PACK) ×3 IMPLANT

## 2015-02-02 NOTE — Anesthesia Postprocedure Evaluation (Signed)
  Anesthesia Post-op Note  Patient: Cassandra Scott  Procedure(s) Performed: Procedure(s): CESAREAN SECTION (N/A)  Patient Location: PACU  Anesthesia Type:Spinal  Level of Consciousness: awake, alert  and oriented  Airway and Oxygen Therapy: Patient Spontanous Breathing  Post-op Pain: none  Post-op Assessment: Post-op Vital signs reviewed, Patient's Cardiovascular Status Stable, Respiratory Function Stable, Patent Airway, No signs of Nausea or vomiting, Pain level controlled, No headache, No backache, Spinal receding and Patient able to bend at knees              Post-op Vital Signs: Reviewed and stable  Last Vitals:  Filed Vitals:   02/02/15 0958  BP: 97/56  Pulse: 67  Temp: 37.7 C  Resp: 18    Complications: No apparent anesthesia complications

## 2015-02-02 NOTE — Anesthesia Postprocedure Evaluation (Signed)
  Anesthesia Post-op Note  Patient: Cassandra Scott  Procedure(s) Performed: Procedure(s): CESAREAN SECTION (N/A)  Patient Location: Mother/Baby  Anesthesia Type:Spinal  Level of Consciousness: awake  Airway and Oxygen Therapy: Patient Spontanous Breathing  Post-op Pain: mild  Post-op Assessment: Patient's Cardiovascular Status Stable and Respiratory Function Stable              Post-op Vital Signs: stable  Last Vitals:  Filed Vitals:   02/02/15 1315  BP: 93/53  Pulse: 63  Temp: 36.8 C  Resp: 18    Complications: No apparent anesthesia complications

## 2015-02-02 NOTE — Addendum Note (Signed)
Addendum  created 02/02/15 1557 by Renford DillsJanet L Kala Ambriz, CRNA   Modules edited: Notes Section   Notes Section:  File: [id number]

## 2015-02-02 NOTE — Lactation Note (Signed)
This note was copied from the chart of Cassandra Ellsworth LennoxJennifer Scott. Lactation Consultation Note  Patient Name: Cassandra Ellsworth LennoxJennifer Scott ZOXWR'UToday's Date: 02/02/2015 Reason for consult: Initial assessment;Breast surgery Baby has not latched since after delivery, has been spitty/grunting today. Baby spit up clear fluid/mucous at this visit. Attempted to latch baby but baby would not suckle on Mom's breast or LC finger. Baby not grunting when quiet but begins grunting with much stimulation, color remains pink. Mom has history of breast implants but did BF her 1st baby for 2 months. Mom's nipples are erect but with short shaft. Gave hand pump for Mom to use to pre-pump. Mom reports she has been leaking from her breasts since about 25 weeks of pregnancy. Advised Mom to BF with feeding ques, if baby will not latch use hand pump to post pump for 10-15 minutes both breasts and give baby back any amount of colostrum that she receives. Mom pumping when LC left and receiving some colostrum. Advised RN that Mom will call after she finishing pumping this visit for RN to help Mom with spoon feeding this back to baby. Lactation brochure left for review, advised of OP services and support group. Mom to call for assist.   Maternal Data Has patient been taught Hand Expression?: Yes Does the patient have breastfeeding experience prior to this delivery?: Yes  Feeding Feeding Type: Breast Fed Length of feed: 0 min  LATCH Score/Interventions Latch: Repeated attempts needed to sustain latch, nipple held in mouth throughout feeding, stimulation needed to elicit sucking reflex. Intervention(s): Adjust position;Assist with latch;Breast massage;Breast compression  Audible Swallowing: None Intervention(s): Skin to skin;Hand expression  Type of Nipple: Everted at rest and after stimulation  Comfort (Breast/Nipple): Soft / non-tender     Hold (Positioning): Assistance needed to correctly position infant at breast and maintain  latch. Intervention(s): Breastfeeding basics reviewed;Support Pillows;Position options;Skin to skin  LATCH Score: 6  Lactation Tools Discussed/Used Tools: Pump Breast pump type: Manual   Consult Status Consult Status: Follow-up Date: 02/03/15 Follow-up type: In-patient    Alfred LevinsGranger, Amreen Raczkowski Ann 02/02/2015, 7:56 PM

## 2015-02-02 NOTE — Transfer of Care (Signed)
Immediate Anesthesia Transfer of Care Note  Patient: Cassandra Scott  Procedure(s) Performed: Procedure(s): CESAREAN SECTION (N/A)  Patient Location: PACU  Anesthesia Type:Spinal  Level of Consciousness: awake  Airway & Oxygen Therapy: Patient Spontanous Breathing  Post-op Assessment: Report given to RN  Post vital signs: Reviewed and stable  Last Vitals:  Filed Vitals:   02/02/15 0609  Pulse: 82  Temp: 36.7 C  Resp: 18    Complications: No apparent anesthesia complications

## 2015-02-02 NOTE — Anesthesia Preprocedure Evaluation (Signed)
Anesthesia Evaluation  Patient identified by MRN, date of birth, ID band Patient awake    Reviewed: Allergy & Precautions, NPO status , Patient's Chart, lab work & pertinent test results  History of Anesthesia Complications (+) history of anesthetic complications  Airway Mallampati: II  TM Distance: >3 FB Neck ROM: Full    Dental no notable dental hx. (+) Teeth Intact   Pulmonary neg pulmonary ROS,    Pulmonary exam normal breath sounds clear to auscultation       Cardiovascular + Past MI  Normal cardiovascular exam Rhythm:Regular Rate:Normal     Neuro/Psych  Headaches, negative psych ROS   GI/Hepatic   Endo/Other    Renal/GU   negative genitourinary   Musculoskeletal negative musculoskeletal ROS (+)   Abdominal (+) - obese,   Peds  Hematology   Anesthesia Other Findings   Reproductive/Obstetrics (+) Pregnancy Previous C/Section                             Anesthesia Physical Anesthesia Plan  ASA: II  Anesthesia Plan: Spinal   Post-op Pain Management:    Induction:   Airway Management Planned: Natural Airway  Additional Equipment:   Intra-op Plan:   Post-operative Plan:   Informed Consent: I have reviewed the patients History and Physical, chart, labs and discussed the procedure including the risks, benefits and alternatives for the proposed anesthesia with the patient or authorized representative who has indicated his/her understanding and acceptance.     Plan Discussed with: Anesthesiologist, CRNA and Surgeon  Anesthesia Plan Comments:         Anesthesia Quick Evaluation

## 2015-02-02 NOTE — Consult Note (Signed)
[facility]  Delivery Note:  C-section       02/02/2015  7:51 AM  I was called to the operating room at the request of the patient's obstetrician (Dr. Ambrose MantleHenley) for a repeat c-section.  PRENATAL HX:  34 y.o G2P1001 at 4939 and 1/[redacted] weeks gestation admitted for repeat c-section.  Her pregnancy has been uncomplicated.  INTRAPARTUM HX:   Repeat c-section with AROM at delivery  DELIVERY:  Infant was vigorous at delivery, requiring no resuscitation other than standard warming, drying and stimulation.  APGARs 8 and 9.  Exam within normal limits.  After 5 minutes, baby left with nurse to assist parents with skin-to-skin care.   _____________________ Electronically Signed By: Maryan CharLindsey Annastyn Silvey, MD Neonatologist

## 2015-02-02 NOTE — Op Note (Signed)
[name]:  Kimmet, Collyns             ACCOUNT NO.:  [id number]  MEDICAL RECORD NO.:  [id number]  LOCATION:  [facility]                          [facility]AC[facility]  PHYSICIAN:  Malachi Prohomas F. Ambrose MantleHenley, M.D. DATE OF BIRTH:  1980-11-24  DATE OF PROCEDURE:  02/02/2015 DATE OF DISCHARGE:                              OPERATIVE REPORT   PREOPERATIVE DIAGNOSIS:  Intrauterine pregnancy, 39 weeks 1 day; prior C- section; declined VBAC.  POSTOPERATIVE DIAGNOSIS:  Intrauterine pregnancy, 39 weeks 1 day; prior C-section; declined VBAC.  OPERATION:  Low-transverse cervical C-section.  OPERATOR:  Malachi Prohomas F. Ambrose MantleHenley, M.D.  ASSISTANT:  Dr. Mindi SlickerBanga.  ANESTHESIA:  Spinal anesthesia by Dr. Malen GauzeFoster.  DESCRIPTION OF PROCEDURE:  The patient was brought to the operating room and given a spinal anesthetic by Dr. Malen GauzeFoster.  The patient was placed supine, tilted to the left.  The vulva, vagina, and urethra were prepped with Betadine solution.  A Foley catheter was inserted to straight drain.  The abdomen was then prepped with a DuraPrep and allowed to dry for 3 minutes, during which time, a time-out was done.  The abdomen was then draped as a sterile field.  The patient had body-art on the lower abdomen.  The old incision actually went through the body-art on both sides.  I tried to stay in the exact same incision after checking for anesthesia.  We went down through the skin, subcutaneous tissue, and the fascia.  The fascia was incised transversely, separated from the rectus muscles superiorly and inferiorly.  Peritoneum was opened vertically and stretched open more, and an Alexis retractor was used to visualize the lower uterine segment.  I checked to make sure no bowel was entrapped in the wings of the Alexis retractor and made a short 3 cm incision transversely in the lower uterine segment, through the superficial layers of the myometrium, then went into the amniotic sac with my finger.  I enlarged the incision by pulling  superiorly and inferiorly. The infant's vertex delivered easily through the incisional opening with some fundal pressure by Dr. Mindi SlickerBanga.  The nose and pharynx were suctioned with the bulb.  The rest of the baby was delivered without difficulty. Cord was clamped.  Routine cord blood studies were obtained.  The baby was given to the neonatologist who was in attendance.  Apgars were 8 and 9 at 1 and 5 minutes.  The placenta was removed.  The inside of the uterus was inspected and found to be free of any debris.  It was massaged to try to increase the tone with Pitocin running.  Both tubes and ovaries appeared normal as did the uterus.  The uterine incision was then closed after I enlarged the cervical opening with a long ring forceps.  The incision was closed with two running sutures of 0 Vicryl locking on the first layer, nonlocking on the second layer.  There was a heavy bleeder at the right side of the incision, but it was not bleeding at the end of the closure.  I liberally irrigated the pelvis.  There was no bleeding.  I checked both gutters for any debris, cleared them.  The Alexis retractor was removed, and the abdominal wall was closed  in layers using interrupted sutures of 0 Vicryl to close the rectus muscle and peritoneum in one layer, two running sutures of 0 Vicryl on the fascia, running 3-0 Vicryl on the subcutaneous tissue, and 4-0 Keith needle on the skin.  There was one area that was bleeding a little bit. So, I put three staples in that to reinforce the closure at that point. There was no significant bleeding then, and the incision was treated with benzoin above and below the incision with Steri-Strips across the incision.  A honeycomb dressing was applied, and a pressure dressing on top of that.  The patient seemed to tolerate the procedure well.  Blood loss was estimated by the nurse anesthetist of 800 mL.     Malachi Pro. Ambrose Mantle, M.D.     TFH/MEDQ  D:  02/02/2015  T:   02/02/2015  Job:  [id number]

## 2015-02-02 NOTE — Progress Notes (Signed)
Patient ID: Cassandra LennoxJennifer Scott, female   DOB: 17-Jul-1980, 34 y.o.   MRN: [id number] I examined this lady 02-01-15 and she reports no change in her health since that time.

## 2015-02-03 ENCOUNTER — Encounter (HOSPITAL_COMMUNITY): Payer: Self-pay | Admitting: Obstetrics and Gynecology

## 2015-02-03 LAB — CBC
HEMATOCRIT: 29.3 % — AB (ref 36.0–46.0)
HEMOGLOBIN: 9.7 g/dL — AB (ref 12.0–15.0)
MCH: 30.3 pg (ref 26.0–34.0)
MCHC: 33.1 g/dL (ref 30.0–36.0)
MCV: 91.6 fL (ref 78.0–100.0)
Platelets: 117 10*3/uL — ABNORMAL LOW (ref 150–400)
RBC: 3.2 MIL/uL — AB (ref 3.87–5.11)
RDW: 14.5 % (ref 11.5–15.5)
WBC: 7.6 10*3/uL (ref 4.0–10.5)

## 2015-02-03 LAB — BIRTH TISSUE RECOVERY COLLECTION (PLACENTA DONATION)

## 2015-02-03 NOTE — Lactation Note (Signed)
This note was copied from the chart of Boy Ellsworth LennoxJennifer Bridgers. Lactation Consultation Note Follow up visit at 38 hours of age.  Mom reports baby is doing much better and just finished a 40 minute feeding.  No additional void since 0645 this am.  Encouraged mom to continue to feed on demand, she reports seeing colostrum with HE.  Mom only starts latch with NS and then removed for remainder of feeding.    Patient Name: Boy Ellsworth LennoxJennifer Burcher WUJWJ'XToday's Date: 02/03/2015     Maternal Data    Feeding Feeding Type: Breast Fed Length of feed: 40 min  LATCH Score/Interventions                      Lactation Tools Discussed/Used     Consult Status      Sherron Mapp, Arvella MerlesJana Lynn 02/03/2015, 10:34 PM

## 2015-02-03 NOTE — Progress Notes (Signed)
Patient ID: Cassandra Scott, female   DOB: 1980/04/14, 34 y.o.   MRN: [id number] #1 afebrile BP normal Output good. She has tolerated a regular diet and passed flatus. She has ambulated HGB 12.1 to 9.7.

## 2015-02-03 NOTE — Progress Notes (Signed)
CLINICAL SOCIAL WORK MATERNAL/CHILD NOTE  Patient Details  Name: Cassandra Scott MRN: [id number] Date of Birth: 02/02/2013  Date: 02/03/2015  Clinical Social Worker Initiating Note: Yazmin Rodriguez BSW, MSW intern Date/ Time Initiated: 02/03/15/1000   Child's Name: Cassandra Scott    Legal Guardian: Beverlee Footman and Jason Young   Need for Interpreter: None   Date of Referral: 02/02/15   Reason for Referral: Behavioral Health Issues, including SI    Referral Source: [facility]   Address: [address] [place], [place] [address]  Phone number: [phone]   Household Members: Self, Minor Children, Significant Other   Natural Supports (not living in the home): Spouse/significant other, Immediate Family   Professional Supports:None   Employment:Unemployed   Type of Work:     Education: High school graduate   Financial Resources:Medicaid   Other Resources:     Cultural/Religious Considerations Which May Impact Care:None Reported   Strengths: Ability to meet basic needs , Home prepared for child    Risk Factors/Current Problems: Mental Health Concerns - MOB presents with a history of anxiety. Per MOB, it is not a concerning issue for her and she only experiences anxiety when under a lot of stress. MOB denied suffering from PPD after her first pregnancy and stated she has never been prescribed medications or been under therapy.   Cognitive State: Insightful , Linear Thinking , Alert , Goal Oriented , Able to Concentrate    Mood/Affect: Happy , Interested , Bright , Comfortable , Tearful , Relaxed    CSW Assessment: MSW intern presented in patient's room due to a history of anxiety. FOB was present in the room and MOB provided verbal consent for MSW intern to engage. Per MOB, she was feeling well postpartum and just had some abdominal pain due to her C-section. MOB shared the C-section was planned but still a  stressful and emotional experience for her to go through. She expressed she was as mentally prepared as she could be for it but still got very emotional and anxious when it was time to deliver her infant. MOB shared the C-section went well and she was content with the birthing process but just was emotional prior to having her infant because of the fear of something going wrong. MOB voiced her first pregnancy being a C-section and therefore having an idea of what to expect but it still wasn't enough to reduce her stress and fears of having a second one. MOB shared she is breastfeeding but the infant is not latching on properly therefore she feels like he is not feeding enough. MOB expressed she only wants to breastfeed because of the stigma behind there being "worms" in formula cans. However, MOB shared she is not fully against formula feeding if she has to in order for her infant to be healthy and fed. MOB voiced she will do whatever she can for her infant to be fully breastfeed though. MOB shared she has a great support from FOB and his family. According to MOB, she has met all of the infant's basic needs and is prepared to take the infant home. 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 MOB expressed she wasn't able to have a long bonding period of time postpartum with her first son because she was a single mother and had to return back to work right away.   MSW intern inquired about MOB's mental health during the   pregnancy and prior to it. MOB denied having any history of anxiety prior to the pregnancy or being prescribed any medications for it. MOB defined her feelings as feelings of stress during difficult situations that can at times lead to her feeling anxious about things but never to a level were she was concerned. MOB shared she was anxious towards the end of this pregnancy primarily because of her fear of having a C-section  and the whole birthing process. MOB shared she was very emotional and "worried" about how the whole process would go. MOB denied any feelings of anxiety postpartum and along with any history of PPD after her first pregnancy. MSW intern provided education on perinatal mood disorders and the hospital's support group, "Feelings After Birth." During that conversation the nurse was in the room checking on the infant and asked MOB if she could take the infant to the nursery to have the pediatrician further analyze him because he was breathing heavily. MOB then became tearful and started to display a sense of worry as evidence by her tears and her starting to itch and scratch her legs. FOB noticed her behavior as well and asked her if she was nervous and reminded her everything would be okay and to not stress about it. MSW intern then asked MOB to discuss her feelings and process her thoughts with her. MOB was able to calm down and rationalize with herself that everything was okay and the nurse was just being proactive and all would be fine. She then saw it as a benefit for her nurse to take the infant into the nursery so they could see if his difficulty breathing was affecting his ability to breastfeed. MSW intern provided MOB with motivational interviewing and reminded MOB of her strength and in regard to dealing with the struggles she described as a single mother along with reminding her she had a good support system now. MSW intern discussed the importance of self-care and went over coping skills to deal with any potential feelings of anxiety postpartum. MOB voiced she loved to take hot baths and watch her favorite TV shows. MOB shared she would take advantage of FOB being there to help this month and make sure she took care of herself as well as her infant.   MOB denied having any further questions or concerns but agreed to contact MSW intern if needs arise. MOB was very appreciative of the information provided by  the MSW intern.  CSW Plan/Description:  Patient/Family Education- MSW intern provided education on perinatal mood disorders and the hospital's support group, "Feelings After Birth."  No Further Intervention Required/No Barriers to Discharge    Yazmin Rodriguez, Student-SW 02/03/2015, 11:31 AM 

## 2015-02-03 NOTE — Lactation Note (Signed)
This note was copied from the chart of Boy Ellsworth LennoxJennifer Bagshaw. Lactation Consultation Note  Mother with breast implants has small nipples that are tender. Good flow of colostrum easily expressed. Had mother prepump w/ hand pump. Mother hand expressed drops of colostrum and attempted to latch in cross cradle position. Baby grunting and would not grasp nipple w/ mouth.  Applied #16NS and baby latched.  Sucks and swallows observed w/ no grunting. After 5 min baby came off NS and suggest mother now relatch without NS.  Baby easily latched.  Provided head roll for support. Recommend mother compress breast during feeding to keep baby active. Sucks and swallows observed. Mother states w/ initial latch her nipples are sore.  Discussed applying ebm and provided her w/ comfort gels.  Suggest she attempt latching without NS at first and if he seems frustrated and will not latch, use NS and then take off later in feeding. Follow this until baby latches easily.  Noted good amount of colostrum in nipple shield after feeding.   Patient Name: Boy Ellsworth LennoxJennifer Hudler KGURK'YToday's Date: 02/03/2015 Reason for consult: Follow-up assessment   Maternal Data    Feeding Feeding Type: Breast Fed  LATCH Score/Interventions Latch: Repeated attempts needed to sustain latch, nipple held in mouth throughout feeding, stimulation needed to elicit sucking reflex.  Audible Swallowing: Spontaneous and intermittent Intervention(s): Skin to skin;Hand expression  Type of Nipple: Everted at rest and after stimulation  Comfort (Breast/Nipple): Filling, red/small blisters or bruises, mild/mod discomfort  Problem noted: Mild/Moderate discomfort Interventions (Mild/moderate discomfort): Hand expression;Comfort gels  Hold (Positioning): Assistance needed to correctly position infant at breast and maintain latch.  LATCH Score: 7  Lactation Tools Discussed/Used Tools: Nipple Shields Nipple shield size: 16 Breast pump type:  Manual   Consult Status Consult Status: Follow-up Date: 02/04/15 Follow-up type: In-patient    Dahlia ByesBerkelhammer, Shristi Scheib [facility] 02/03/2015, 1:32 PM

## 2015-02-03 NOTE — Lactation Note (Signed)
This note was copied from the chart of Boy Ellsworth LennoxJennifer Skillin. Lactation Consultation Note  Baby in nursery and will be returning soon.  Left LC phone number and suggest parents call for LC to view feeding.  Patient Name: Boy Ellsworth LennoxJennifer Koos ZOXWR'UToday's Date: 02/03/2015     Maternal Data    Feeding    LATCH Score/Interventions                      Lactation Tools Discussed/Used     Consult Status      Dahlia ByesBerkelhammer, Mailin Coglianese [facility] 02/03/2015, 11:47 AM

## 2015-02-04 MED ORDER — IBUPROFEN 600 MG PO TABS: 600.0000 mg | ORAL_TABLET | Freq: Four times a day (QID) | ORAL | Status: DC | PRN

## 2015-02-04 MED ORDER — OXYCODONE-ACETAMINOPHEN 5-325 MG PO TABS: 1.0000 | ORAL_TABLET | Freq: Four times a day (QID) | ORAL | Status: DC | PRN

## 2015-02-04 NOTE — Lactation Note (Signed)
This note was copied from the chart of Cassandra Ellsworth LennoxJennifer Scott. Lactation Consultation Note  Baby continues to need #16NS at first to assist w/ latch then within 5 min she takes NS off and he latches back without. Continue to try latching without NS.  Transitional breastmilk viewed in NS after only 5 min of feeding. Baby went back to breast easily without NS.  Sucks and swallows observed. Encouraged mother to breastfeed on both breasts during each feeding. Referred her to [email or web address].org website for further information about bf w/ implants. Reviewed engorgement care and monitoring voids/stools. Reminded parents to set alarm during the night and wake baby is he does not wake to feed after 3.5 hours. Mom encouraged to feed baby 8-12 times/24 hours and with feeding cues.  Provided mother with another #16NS and suggest she call if needs further assistance.  Patient Name: Cassandra Ellsworth LennoxJennifer Scott ZHYQM'VToday's Date: 02/04/2015 Reason for consult: Follow-up assessment   Maternal Data    Feeding Feeding Type: Breast Fed Length of feed: 20 min  LATCH Score/Interventions Latch: Grasps breast easily, tongue down, lips flanged, rhythmical sucking. Intervention(s): Skin to skin;Waking techniques  Audible Swallowing: Spontaneous and intermittent  Type of Nipple: Everted at rest and after stimulation  Comfort (Breast/Nipple): Filling, red/small blisters or bruises, mild/mod discomfort  Problem noted: Mild/Moderate discomfort Interventions (Mild/moderate discomfort): Comfort gels;Hand expression;Pre-pump if needed  Hold (Positioning): No assistance needed to correctly position infant at breast.  LATCH Score: 9  Lactation Tools Discussed/Used Tools: Nipple Shields Nipple shield size: 16 Breast pump type: Manual   Consult Status Consult Status: Complete    Hardie PulleyBerkelhammer, Ruth Boschen 02/04/2015, 9:12 AM

## 2015-02-04 NOTE — Discharge Instructions (Signed)
booklet °

## 2015-02-04 NOTE — Discharge Summary (Signed)
[name]:  Seidner, Kamirah             ACCOUNT NO.:  [id number]  MEDICAL RECORD NO.:  [id number]  LOCATION:  9132                          FACILITY:  [facility]  PHYSICIAN:  Malachi Prohomas F. Ambrose MantleHenley, M.D. DATE OF BIRTH:  04-23-1980  DATE OF ADMISSION:  02/02/2015 DATE OF DISCHARGE:  02/04/2015                              DISCHARGE SUMMARY   HISTORY:  A 34 year old white female, para 1-0-0-1, gravida 2 at 39 weeks and 1 day admitted for repeat C-section.  She had a prior C- section and declined vaginal birth after cesarean.  She underwent a low- transverse cervical C-section by Dr. Ambrose MantleHenley with Dr. Mindi SlickerBanga assisting under spinal anesthesia by Dr. Malen GauzeFoster.  No complications were encountered.  Postoperatively, the patient did quite well and was discharged on the second postpartum day.  Ambulating well.  Voiding well.  Tolerating a regular diet.  Passing flatus and having bowel movements.  Her initial hemoglobin was 12.1, hematocrit 36.2, white count 7400, platelet count 156,000.  Followup hemoglobin 9.7.  FINAL DIAGNOSES:  Intrauterine pregnancy at 39+ weeks, delivered by C- section, status post product prior C-section, declined VBAC.  OPERATION:  Low-transverse cervical C-section.  FINAL CONDITION:  Improved.  INSTRUCTIONS:  Include our regular discharge instruction booklet, our after visit summary.  Prescriptions for Percocet 5/325, 15 tablets, 1 every 6 hours as needed for pain and Motrin 600 mg 30 tablets, 1 every 6 hours as needed for pain.  She is advised to return to the office in 1 week to have her baby circumcised and to check her incision.  She is to resume her prenatal vitamins, and she is advised not to have intercourse prior to her 6 weeks' checkup, so she would be a candidate for any method of birth control.     Malachi Prohomas F. Ambrose MantleHenley, M.D.     TFH/MEDQ  D:  02/04/2015  T:  02/04/2015  Job:  [id number]

## 2015-02-04 NOTE — Progress Notes (Signed)
Patient ID: Cassandra LennoxJennifer Scott, female   DOB: 28-Mar-1980, 34 y.o.   MRN: [id number] #2 afebrile BP normal Has had a BM and is ready for d/c

## 2016-04-28 ENCOUNTER — Encounter (HOSPITAL_BASED_OUTPATIENT_CLINIC_OR_DEPARTMENT_OTHER): Payer: Self-pay | Admitting: Emergency Medicine

## 2016-04-28 ENCOUNTER — Emergency Department (HOSPITAL_BASED_OUTPATIENT_CLINIC_OR_DEPARTMENT_OTHER)
Admission: EM | Admit: 2016-04-28 | Discharge: 2016-04-28 | Disposition: A | Discharge status: Home / Self Care | Payer: Medicaid Other | Attending: Emergency Medicine | Admitting: Emergency Medicine

## 2016-04-28 DIAGNOSIS — K429 Umbilical hernia without obstruction or gangrene: Secondary | ICD-10-CM | POA: Insufficient documentation

## 2016-04-28 DIAGNOSIS — R1033 Periumbilical pain: Secondary | ICD-10-CM | POA: Diagnosis present

## 2016-04-28 NOTE — ED Notes (Signed)
Patient given instructions for emergencies related to her hernia.

## 2016-04-28 NOTE — ED Provider Notes (Signed)
[facility]   CSN: [id number] Arrival date & time: 04/28/16  1541     History   Chief Complaint Chief Complaint  Patient presents with  . Abdominal Pain    HPI Francisco Eyerly is a 36 y.o. female.  Patient is a 36 year old female who presents with a knot to her abdomen. She states over the last week she's had some intermittent abdominal cramping which resolves after a bowel movement. She's felt more gassy than normal. Last night she noticed a lump adjacent to her bellybutton. It's nontender. It seems to come and go. She denies any nausea or vomiting. No known fevers. No urinary symptoms. She had a pregnancy with delivery 15 months ago.      Past Medical History:  Diagnosis Date  . Anxiety   . Complication of anesthesia    spinal headache  . GERD (gastroesophageal reflux disease)   . Headache     Patient Active Problem List   Diagnosis Date Noted  . H/O cesarean section 02/02/2015    Past Surgical History:  Procedure Laterality Date  . BREAST SURGERY    . CESAREAN SECTION    . CESAREAN SECTION N/A 02/02/2015   Procedure: CESAREAN SECTION;  Surgeon: Tracey Harries, MD;  Location: [facility];  Service: Obstetrics;  Laterality: N/A;    OB History    Gravida Para Term Preterm AB Living   2 2 2     1    SAB TAB Ectopic Multiple Live Births         0 1       Home Medications    Prior to Admission medications   Medication Sig Start Date End Date Taking? Authorizing Provider  ibuprofen (ADVIL,MOTRIN) 600 MG tablet Take 1 tablet (600 mg total) by mouth every 6 (six) hours as needed for mild pain. 02/04/15   Tracey Harries, MD  oxyCODONE-acetaminophen (PERCOCET/ROXICET) 5-325 MG tablet Take 1 tablet by mouth every 6 (six) hours as needed (for pain scale 4-7). 02/04/15   Tracey Harries, MD  Prenatal Vit-Fe Fumarate-FA (MULTIVITAMIN-PRENATAL) 27-0.8 MG TABS tablet Take 1 tablet by mouth daily at 12 noon.    Historical Provider, MD    Family  History History reviewed. No pertinent family history.  Social History Social History  Substance Use Topics  . Smoking status: Never Smoker  . Smokeless tobacco: Never Used  . Alcohol use No     Allergies   Patient has no known allergies.   Review of Systems Review of Systems  Constitutional: Negative for chills, diaphoresis, fatigue and fever.  HENT: Negative for congestion, rhinorrhea and sneezing.   Eyes: Negative.   Respiratory: Negative for cough, chest tightness and shortness of breath.   Cardiovascular: Negative for chest pain and leg swelling.  Gastrointestinal: Negative for abdominal pain, blood in stool, diarrhea, nausea and vomiting.       Nontender lump  Genitourinary: Negative for difficulty urinating, flank pain, frequency and hematuria.  Musculoskeletal: Negative for arthralgias and back pain.  Skin: Negative for rash.  Neurological: Negative for dizziness, speech difficulty, weakness, numbness and headaches.     Physical Exam Updated Vital Signs BP 134/86 (BP Location: Right Arm)   Pulse 89   Temp 98.7 F (37.1 C) (Oral)   Resp 18   Ht 5\' 2"  (1.575 m)   Wt 153 lb (69.4 kg)   SpO2 100%   BMI 27.98 kg/m   Physical Exam  Constitutional: She is oriented to person, place, and time. She appears  well-developed and well-nourished.  HENT:  Head: Normocephalic and atraumatic.  Eyes: Pupils are equal, round, and reactive to light.  Neck: Normal range of motion. Neck supple.  Cardiovascular: Normal rate, regular rhythm and normal heart sounds.   Pulmonary/Chest: Effort normal and breath sounds normal. No respiratory distress. She has no wheezes. She has no rales. She exhibits no tenderness.  Abdominal: Soft. Bowel sounds are normal. There is no tenderness. There is no rebound and no guarding.  Patient has a small, 1 cm easily reducible hernia to her umbilical area. It goes away spontaneously when she lays down. It is palpable when she stands up but is  nontender and easily reducible.  Musculoskeletal: Normal range of motion. She exhibits no edema.  Lymphadenopathy:    She has no cervical adenopathy.  Neurological: She is alert and oriented to person, place, and time.  Skin: Skin is warm and dry. No rash noted.  Psychiatric: She has a normal mood and affect.     ED Treatments / Results  Labs (all labs ordered are listed, but only abnormal results are displayed) Labs Reviewed - No data to display  EKG  EKG Interpretation None       Radiology No results found.  Procedures Procedures (including critical care time)  Medications Ordered in ED Medications - No data to display   Initial Impression / Assessment and Plan / ED Course  I have reviewed the triage vital signs and the nursing notes.  Pertinent labs & imaging results that were available during my care of the patient were reviewed by me and considered in my medical decision making (see chart for details).     Patient presents with umbilical hernia. It small and reducible. She has no associated abdominal tenderness. No evidence of obstruction. She was discharged home in good condition. She was given a referral to follow-up with surgery if it continues to bother her. She was given return precautions to return if she has any evidence of an incarcerated hernia which was explained to the patient.  Final Clinical Impressions(s) / ED Diagnoses   Final diagnoses:  Umbilical hernia without obstruction and without gangrene    New Prescriptions New Prescriptions   No medications on file     Rolan BuccoMelanie Deshawn Skelley, MD 04/28/16 1651

## 2016-04-28 NOTE — ED Triage Notes (Addendum)
Patient reports that she has a "bump" that popped up in her umbilical area last night. Patient states that she feels very bloated and bigger than usual. Intermittent "fevers" at home with tmax of 99.5 - The patient states that she has not lost her baby weight yet, however her stomach is not usually this big. Patient states that at times she is having abdominal cramping just before a BM  - last BM last night softer than normal.

## 2018-01-03 ENCOUNTER — Emergency Department (HOSPITAL_BASED_OUTPATIENT_CLINIC_OR_DEPARTMENT_OTHER)
Admission: EM | Admit: 2018-01-03 | Discharge: 2018-01-03 | Disposition: A | Discharge status: Home / Self Care | Payer: Medicaid Other | Attending: Emergency Medicine | Admitting: Emergency Medicine

## 2018-01-03 ENCOUNTER — Emergency Department (HOSPITAL_BASED_OUTPATIENT_CLINIC_OR_DEPARTMENT_OTHER): Payer: Medicaid Other

## 2018-01-03 ENCOUNTER — Encounter (HOSPITAL_BASED_OUTPATIENT_CLINIC_OR_DEPARTMENT_OTHER): Payer: Self-pay | Admitting: *Deleted

## 2018-01-03 ENCOUNTER — Other Ambulatory Visit: Payer: Self-pay

## 2018-01-03 DIAGNOSIS — Z79899 Other long term (current) drug therapy: Secondary | ICD-10-CM | POA: Insufficient documentation

## 2018-01-03 DIAGNOSIS — Y999 Unspecified external cause status: Secondary | ICD-10-CM | POA: Diagnosis not present

## 2018-01-03 DIAGNOSIS — R51 Headache: Secondary | ICD-10-CM | POA: Insufficient documentation

## 2018-01-03 DIAGNOSIS — Y939 Activity, unspecified: Secondary | ICD-10-CM | POA: Diagnosis not present

## 2018-01-03 DIAGNOSIS — S0990XA Unspecified injury of head, initial encounter: Secondary | ICD-10-CM

## 2018-01-03 DIAGNOSIS — Y929 Unspecified place or not applicable: Secondary | ICD-10-CM | POA: Diagnosis not present

## 2018-01-03 MED ORDER — NAPROXEN 250 MG PO TABS
500.0000 mg | ORAL_TABLET | Freq: Once | ORAL | Status: AC
  Administered 2018-01-03: 500 mg via ORAL
  Filled 2018-01-03: qty 2

## 2018-01-03 MED ORDER — METHOCARBAMOL 500 MG PO TABS
1000.0000 mg | ORAL_TABLET | Freq: Once | ORAL | Status: AC
  Administered 2018-01-03: 1000 mg via ORAL
  Filled 2018-01-03: qty 2

## 2018-01-03 MED ORDER — METHOCARBAMOL 500 MG PO TABS: 1000.0000 mg | ORAL_TABLET | Freq: Four times a day (QID) | ORAL | 0 refills | Status: DC

## 2018-01-03 MED ORDER — NAPROXEN 500 MG PO TABS: 500.0000 mg | ORAL_TABLET | Freq: Two times a day (BID) | ORAL | 0 refills | Status: DC

## 2018-01-03 NOTE — ED Notes (Signed)
Pt ate dinner without issue
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
## 2018-01-03 NOTE — ED Notes (Signed)
Pt verbalizes understanding of d/c instructions and denies any further needs at this time. 
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
## 2018-01-07 ENCOUNTER — Emergency Department (HOSPITAL_BASED_OUTPATIENT_CLINIC_OR_DEPARTMENT_OTHER): Payer: Medicaid Other

## 2018-01-07 ENCOUNTER — Emergency Department (HOSPITAL_BASED_OUTPATIENT_CLINIC_OR_DEPARTMENT_OTHER)
Admission: EM | Admit: 2018-01-07 | Discharge: 2018-01-07 | Disposition: A | Discharge status: Home / Self Care | Payer: Medicaid Other | Attending: Emergency Medicine | Admitting: Emergency Medicine

## 2018-01-07 ENCOUNTER — Encounter (HOSPITAL_BASED_OUTPATIENT_CLINIC_OR_DEPARTMENT_OTHER): Payer: Self-pay

## 2018-01-07 ENCOUNTER — Other Ambulatory Visit: Payer: Self-pay

## 2018-01-07 DIAGNOSIS — Y998 Other external cause status: Secondary | ICD-10-CM | POA: Diagnosis not present

## 2018-01-07 DIAGNOSIS — Z79899 Other long term (current) drug therapy: Secondary | ICD-10-CM | POA: Insufficient documentation

## 2018-01-07 DIAGNOSIS — S0990XA Unspecified injury of head, initial encounter: Secondary | ICD-10-CM | POA: Diagnosis present

## 2018-01-07 DIAGNOSIS — Y92410 Unspecified street and highway as the place of occurrence of the external cause: Secondary | ICD-10-CM | POA: Diagnosis not present

## 2018-01-07 DIAGNOSIS — S060X0A Concussion without loss of consciousness, initial encounter: Secondary | ICD-10-CM | POA: Diagnosis not present

## 2018-01-07 DIAGNOSIS — S161XXA Strain of muscle, fascia and tendon at neck level, initial encounter: Secondary | ICD-10-CM | POA: Insufficient documentation

## 2018-01-07 DIAGNOSIS — Y939 Activity, unspecified: Secondary | ICD-10-CM | POA: Insufficient documentation

## 2018-01-07 DIAGNOSIS — S161XXD Strain of muscle, fascia and tendon at neck level, subsequent encounter: Secondary | ICD-10-CM

## 2018-01-07 DIAGNOSIS — S060X0D Concussion without loss of consciousness, subsequent encounter: Secondary | ICD-10-CM

## 2018-01-07 MED ORDER — DIAZEPAM 5 MG PO TABS
5.0000 mg | ORAL_TABLET | Freq: Once | ORAL | Status: AC
  Administered 2018-01-07: 5 mg via ORAL
  Filled 2018-01-07: qty 1

## 2018-01-07 MED ORDER — DIAZEPAM 5 MG PO TABS: 5.0000 mg | ORAL_TABLET | Freq: Two times a day (BID) | ORAL | 0 refills | Status: DC

## 2018-01-07 MED ORDER — KETOROLAC TROMETHAMINE 60 MG/2ML IM SOLN
60.0000 mg | Freq: Once | INTRAMUSCULAR | Status: AC
  Administered 2018-01-07: 60 mg via INTRAMUSCULAR
  Filled 2018-01-07: qty 2

## 2018-01-07 NOTE — ED Notes (Signed)
Pt reports taking naproxen / flexeril as prescribed with no relief.
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
## 2018-01-07 NOTE — ED Notes (Signed)
ED Provider at bedside. 
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
## 2018-01-30 ENCOUNTER — Other Ambulatory Visit: Payer: Self-pay

## 2018-01-30 ENCOUNTER — Ambulatory Visit: Payer: Medicaid Other | Attending: Family Medicine

## 2018-01-30 DIAGNOSIS — R258 Other abnormal involuntary movements: Secondary | ICD-10-CM | POA: Diagnosis present

## 2018-01-30 DIAGNOSIS — R293 Abnormal posture: Secondary | ICD-10-CM | POA: Diagnosis not present

## 2018-01-30 DIAGNOSIS — M542 Cervicalgia: Secondary | ICD-10-CM | POA: Insufficient documentation

## 2018-01-30 NOTE — Patient Instructions (Signed)
Supine Scapular Retraction   reps: 5 sets: 1   hold: 5 daily: 1   weekly: 7   Supine Chin Tuck   reps: 5 sets: 1   hold: 5 daily: 1   weekly: 7   Standing Backward Shoulder Rolls   reps: 5-10 sets: 1   daily: 1 weekly: 7  Seated Neck Sidebending Stretch   reps: 3 sets: 1   hold: 15 daily: 1   weekly: 7
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
## 2018-02-08 ENCOUNTER — Encounter

## 2018-02-13 ENCOUNTER — Ambulatory Visit: Payer: Medicaid Other

## 2018-02-15 ENCOUNTER — Ambulatory Visit: Payer: Medicaid Other | Admitting: Physical Therapy

## 2018-02-20 ENCOUNTER — Telehealth: Payer: Self-pay | Admitting: Physical Therapy

## 2018-02-20 ENCOUNTER — Ambulatory Visit: Payer: Medicaid Other | Admitting: Physical Therapy

## 2018-02-20 NOTE — Telephone Encounter (Signed)
No call no show for 2:15 PM appointment. Attempted to call patient with number in chart but there was automated message that number was "unavailable".

## 2018-06-28 ENCOUNTER — Emergency Department (HOSPITAL_BASED_OUTPATIENT_CLINIC_OR_DEPARTMENT_OTHER): Payer: Medicaid Other

## 2018-06-28 ENCOUNTER — Encounter (HOSPITAL_BASED_OUTPATIENT_CLINIC_OR_DEPARTMENT_OTHER): Payer: Self-pay | Admitting: *Deleted

## 2018-06-28 ENCOUNTER — Other Ambulatory Visit: Payer: Self-pay

## 2018-06-28 ENCOUNTER — Emergency Department (HOSPITAL_BASED_OUTPATIENT_CLINIC_OR_DEPARTMENT_OTHER)
Admission: EM | Admit: 2018-06-28 | Discharge: 2018-06-28 | Disposition: A | Discharge status: Home / Self Care | Payer: Medicaid Other | Attending: Emergency Medicine | Admitting: Emergency Medicine

## 2018-06-28 DIAGNOSIS — G44209 Tension-type headache, unspecified, not intractable: Secondary | ICD-10-CM | POA: Insufficient documentation

## 2018-06-28 DIAGNOSIS — G8929 Other chronic pain: Secondary | ICD-10-CM | POA: Diagnosis not present

## 2018-06-28 DIAGNOSIS — Z79899 Other long term (current) drug therapy: Secondary | ICD-10-CM | POA: Diagnosis not present

## 2018-06-28 DIAGNOSIS — M542 Cervicalgia: Secondary | ICD-10-CM | POA: Diagnosis present

## 2018-06-28 MED ORDER — CYCLOBENZAPRINE HCL 10 MG PO TABS: 10.0000 mg | ORAL_TABLET | Freq: Three times a day (TID) | ORAL | 0 refills | Status: DC | PRN

## 2018-06-28 MED ORDER — IBUPROFEN 600 MG PO TABS: 600.0000 mg | ORAL_TABLET | Freq: Three times a day (TID) | ORAL | 0 refills | Status: DC | PRN

## 2018-06-28 NOTE — ED Triage Notes (Signed)
Headache x 5 days. Abdominal pain with diarrhea and vomiting for one day a week ago. No pain today. Today she is here for vomiting, neck and headache.

## 2018-06-28 NOTE — ED Notes (Signed)
Patient stated that Ibuprofen sometimes works.

## 2018-06-28 NOTE — ED Notes (Signed)
ED Provider at bedside. 
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
## 2018-08-15 ENCOUNTER — Other Ambulatory Visit: Payer: Self-pay | Admitting: Urgent Care

## 2018-08-15 DIAGNOSIS — M542 Cervicalgia: Secondary | ICD-10-CM

## 2018-08-31 ENCOUNTER — Other Ambulatory Visit: Payer: Self-pay

## 2018-08-31 ENCOUNTER — Ambulatory Visit
Admission: RE | Admit: 2018-08-31 | Discharge: 2018-08-31 | Disposition: A | Discharge status: Home / Self Care | Payer: Medicaid Other | Source: Ambulatory Visit | Attending: Urgent Care | Admitting: Urgent Care

## 2018-08-31 DIAGNOSIS — M542 Cervicalgia: Secondary | ICD-10-CM

## 2018-08-31 MED ORDER — GADOBENATE DIMEGLUMINE 529 MG/ML IV SOLN
14.0000 mL | Freq: Once | INTRAVENOUS | Status: AC | PRN
  Administered 2018-08-31: 16:00:00 14 mL via INTRAVENOUS

## 2018-09-04 ENCOUNTER — Telehealth: Payer: Self-pay | Admitting: Neurology

## 2018-09-04 ENCOUNTER — Encounter: Payer: Self-pay | Admitting: Neurology

## 2018-09-04 NOTE — Addendum Note (Signed)
Addended by: Minna Antis on: 09/04/2018 03:51 PM   Modules accepted: Orders

## 2018-09-04 NOTE — Telephone Encounter (Signed)
Pt gave consent for VV on the phone/ Pt understands that although there may be some limitations with this type of visit, we will take all precautions to reduce any security or privacy concerns.  Pt understands that this will be treated like an in office visit and we will file with pt's insurance, and there may be a patient responsible charge related to this service. Sent email with link to [email or web address]

## 2018-09-04 NOTE — Telephone Encounter (Signed)
Spoke with patient and updated EMR. 

## 2018-09-05 ENCOUNTER — Ambulatory Visit (INDEPENDENT_AMBULATORY_CARE_PROVIDER_SITE_OTHER): Payer: Medicaid Other | Admitting: Neurology

## 2018-09-05 ENCOUNTER — Encounter: Payer: Self-pay | Admitting: Neurology

## 2018-09-05 ENCOUNTER — Telehealth: Payer: Self-pay | Admitting: Neurology

## 2018-09-05 ENCOUNTER — Other Ambulatory Visit: Payer: Self-pay

## 2018-09-05 DIAGNOSIS — R202 Paresthesia of skin: Secondary | ICD-10-CM | POA: Insufficient documentation

## 2018-09-05 NOTE — Telephone Encounter (Signed)
Medicaid order sent to GI. They will obtain the auth and reach out to the patient to schedule.  
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
## 2018-09-26 ENCOUNTER — Other Ambulatory Visit: Payer: Self-pay

## 2018-09-26 ENCOUNTER — Ambulatory Visit
Admission: RE | Admit: 2018-09-26 | Discharge: 2018-09-26 | Disposition: A | Discharge status: Home / Self Care | Payer: Medicaid Other | Source: Ambulatory Visit | Attending: Neurology | Admitting: Neurology

## 2018-09-26 DIAGNOSIS — R202 Paresthesia of skin: Secondary | ICD-10-CM

## 2018-09-30 ENCOUNTER — Telehealth: Payer: Self-pay | Admitting: *Deleted

## 2018-09-30 NOTE — Telephone Encounter (Signed)
Left message notifying her of the normal results. Provided our number to call back with any questions.

## 2018-09-30 NOTE — Telephone Encounter (Signed)
-----   Message from Marcial Pacas, MD sent at 09/30/2018 11:47 AM EDT ----- Please call pt for normal MRI brain.

## 2019-01-29 ENCOUNTER — Ambulatory Visit: Payer: Medicaid Other | Attending: Orthopedic Surgery | Admitting: Physical Therapy

## 2019-01-29 ENCOUNTER — Encounter: Payer: Self-pay | Admitting: Physical Therapy

## 2019-01-29 ENCOUNTER — Other Ambulatory Visit: Payer: Self-pay

## 2019-01-29 DIAGNOSIS — M542 Cervicalgia: Secondary | ICD-10-CM | POA: Diagnosis present

## 2019-01-29 DIAGNOSIS — R252 Cramp and spasm: Secondary | ICD-10-CM | POA: Diagnosis present
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
## 2019-02-05 ENCOUNTER — Other Ambulatory Visit: Payer: Self-pay

## 2019-02-05 ENCOUNTER — Ambulatory Visit: Payer: Medicaid Other | Admitting: Physical Therapy

## 2019-02-05 DIAGNOSIS — M542 Cervicalgia: Secondary | ICD-10-CM | POA: Diagnosis not present
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
## 2019-02-12 ENCOUNTER — Other Ambulatory Visit: Payer: Self-pay

## 2019-02-12 ENCOUNTER — Ambulatory Visit: Payer: Medicaid Other | Admitting: Physical Therapy

## 2019-02-12 DIAGNOSIS — M542 Cervicalgia: Secondary | ICD-10-CM | POA: Diagnosis not present

## 2019-02-12 DIAGNOSIS — R252 Cramp and spasm: Secondary | ICD-10-CM
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
## 2019-02-26 ENCOUNTER — Other Ambulatory Visit: Payer: Self-pay

## 2019-02-26 ENCOUNTER — Ambulatory Visit: Payer: Medicaid Other | Attending: Orthopedic Surgery | Admitting: Physical Therapy

## 2019-02-26 DIAGNOSIS — R293 Abnormal posture: Secondary | ICD-10-CM | POA: Insufficient documentation

## 2019-02-26 DIAGNOSIS — R252 Cramp and spasm: Secondary | ICD-10-CM | POA: Diagnosis present

## 2019-02-26 DIAGNOSIS — M542 Cervicalgia: Secondary | ICD-10-CM | POA: Insufficient documentation
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
## 2019-03-06 ENCOUNTER — Ambulatory Visit: Payer: Medicaid Other | Admitting: Physical Therapy

## 2019-03-12 ENCOUNTER — Other Ambulatory Visit: Payer: Self-pay

## 2019-03-12 ENCOUNTER — Ambulatory Visit: Payer: Medicaid Other | Admitting: Physical Therapy

## 2019-03-12 DIAGNOSIS — M542 Cervicalgia: Secondary | ICD-10-CM

## 2019-03-12 DIAGNOSIS — R252 Cramp and spasm: Secondary | ICD-10-CM
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
## 2019-03-12 NOTE — Patient Instructions (Signed)

## 2019-03-17 ENCOUNTER — Other Ambulatory Visit: Payer: Self-pay

## 2019-03-17 ENCOUNTER — Ambulatory Visit: Payer: Medicaid Other | Admitting: Physical Therapy

## 2019-03-17 ENCOUNTER — Encounter: Payer: Self-pay | Admitting: Physical Therapy

## 2019-03-17 DIAGNOSIS — R252 Cramp and spasm: Secondary | ICD-10-CM

## 2019-03-17 DIAGNOSIS — M542 Cervicalgia: Secondary | ICD-10-CM | POA: Diagnosis not present

## 2019-03-17 DIAGNOSIS — R293 Abnormal posture: Secondary | ICD-10-CM
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
## 2019-03-26 ENCOUNTER — Encounter: Payer: Medicaid Other | Admitting: Physical Therapy
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
## 2020-02-03 ENCOUNTER — Ambulatory Visit
Admission: EM | Admit: 2020-02-03 | Discharge: 2020-02-03 | Disposition: A | Discharge status: Home / Self Care | Payer: Medicaid Other | Attending: Emergency Medicine | Admitting: Emergency Medicine

## 2020-02-03 DIAGNOSIS — J069 Acute upper respiratory infection, unspecified: Secondary | ICD-10-CM | POA: Diagnosis not present

## 2020-02-03 MED ORDER — CETIRIZINE HCL 10 MG PO TABS: 10.0000 mg | ORAL_TABLET | Freq: Every day | ORAL | 0 refills | Status: DC

## 2020-02-03 MED ORDER — BENZONATATE 100 MG PO CAPS: 100.0000 mg | ORAL_CAPSULE | Freq: Three times a day (TID) | ORAL | 0 refills | Status: DC

## 2020-02-03 MED ORDER — FLUTICASONE PROPIONATE 50 MCG/ACT NA SUSP: 1.0000 | Freq: Every day | NASAL | 0 refills | Status: DC

## 2020-02-03 NOTE — Discharge Instructions (Addendum)
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
## 2020-02-03 NOTE — ED Triage Notes (Signed)
Pt c/o productive cough with green sputum x5 days. States having chest congestion since yesterday.

## 2022-09-05 ENCOUNTER — Other Ambulatory Visit: Payer: Self-pay

## 2022-09-05 ENCOUNTER — Encounter (HOSPITAL_BASED_OUTPATIENT_CLINIC_OR_DEPARTMENT_OTHER): Payer: Self-pay | Admitting: Urology

## 2022-09-05 ENCOUNTER — Emergency Department (HOSPITAL_BASED_OUTPATIENT_CLINIC_OR_DEPARTMENT_OTHER)

## 2022-09-05 ENCOUNTER — Emergency Department (HOSPITAL_BASED_OUTPATIENT_CLINIC_OR_DEPARTMENT_OTHER)
Admission: EM | Admit: 2022-09-05 | Discharge: 2022-09-06 | Disposition: A | Discharge status: Home / Self Care | Attending: Emergency Medicine | Admitting: Emergency Medicine

## 2022-09-05 DIAGNOSIS — M5412 Radiculopathy, cervical region: Secondary | ICD-10-CM | POA: Diagnosis not present

## 2022-09-05 DIAGNOSIS — R202 Paresthesia of skin: Secondary | ICD-10-CM | POA: Diagnosis present

## 2022-09-05 LAB — BASIC METABOLIC PANEL WITH GFR
Anion gap: 8 (ref 5–15)
BUN: 15 mg/dL (ref 6–20)
CO2: 25 mmol/L (ref 22–32)
Calcium: 8.6 mg/dL — ABNORMAL LOW (ref 8.9–10.3)
Chloride: 104 mmol/L (ref 98–111)
Creatinine, Ser: 0.95 mg/dL (ref 0.44–1.00)
GFR, Estimated: >60 mL/min (ref >60)
Glucose, Bld: 96 mg/dL (ref 70–99)
Potassium: 3.2 mmol/L — ABNORMAL LOW (ref 3.5–5.1)
Sodium: 137 mmol/L (ref 135–145)

## 2022-09-05 LAB — CBC
HCT: 37.9 % (ref 36.0–46.0)
Hemoglobin: 13 g/dL (ref 12.0–15.0)
MCH: 29.4 pg (ref 26.0–34.0)
MCHC: 34.3 g/dL (ref 30.0–36.0)
MCV: 85.7 fL (ref 80.0–100.0)
Platelets: 301 10*3/uL (ref 150–400)
RBC: 4.42 MIL/uL (ref 3.87–5.11)
RDW: 12.7 % (ref 11.5–15.5)
WBC: 5.4 10*3/uL (ref 4.0–10.5)
nRBC: 0 % (ref 0.0–0.2)

## 2022-09-05 LAB — TROPONIN I (HIGH SENSITIVITY): Troponin I (High Sensitivity): <2 ng/L (ref <18)

## 2022-09-05 LAB — PREGNANCY, URINE: Preg Test, Ur: NEGATIVE
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
## 2022-09-05 NOTE — ED Notes (Signed)
Patient transported to X-ray 
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
## 2022-09-06 NOTE — ED Notes (Addendum)
Pt verbalizes understanding of all D/C instructions.

## 2022-09-06 NOTE — Discharge Instructions (Addendum)
Cervical radiculopathy as best evaluated with an MRI of the cervical spine.  Please follow-up with your PCP if symptoms persist.  Sometimes symptoms are transient and improve on their own.

## 2022-09-06 NOTE — ED Notes (Signed)
Left sided numbness in foot earlier today, that progressed to arms, fingers and chest @1200  Pain in neck and between shoulder blades
